# Patient Record
Sex: Male | Born: 1970
Health system: Southern US, Community
[De-identification: ages and names within clinical notes are randomized; demographics above are authoritative.]

## PROBLEM LIST (undated history)

## (undated) DIAGNOSIS — Z9889 Other specified postprocedural states: Secondary | ICD-10-CM

## (undated) DIAGNOSIS — E669 Obesity, unspecified: Secondary | ICD-10-CM

## (undated) DIAGNOSIS — M1990 Unspecified osteoarthritis, unspecified site: Secondary | ICD-10-CM

## (undated) DIAGNOSIS — G4730 Sleep apnea, unspecified: Secondary | ICD-10-CM

## (undated) DIAGNOSIS — R112 Nausea with vomiting, unspecified: Secondary | ICD-10-CM

## (undated) DIAGNOSIS — I10 Essential (primary) hypertension: Secondary | ICD-10-CM

## (undated) DIAGNOSIS — R5383 Other fatigue: Secondary | ICD-10-CM

## (undated) DIAGNOSIS — K219 Gastro-esophageal reflux disease without esophagitis: Secondary | ICD-10-CM

## (undated) DIAGNOSIS — E785 Hyperlipidemia, unspecified: Secondary | ICD-10-CM

## (undated) DIAGNOSIS — R7302 Impaired glucose tolerance (oral): Secondary | ICD-10-CM

## (undated) DIAGNOSIS — C6290 Malignant neoplasm of unspecified testis, unspecified whether descended or undescended: Secondary | ICD-10-CM

## (undated) DIAGNOSIS — E119 Type 2 diabetes mellitus without complications: Secondary | ICD-10-CM

## (undated) HISTORY — DX: Gastro-esophageal reflux disease without esophagitis: K21.9

## (undated) HISTORY — DX: Impaired glucose tolerance (oral): R73.02

## (undated) HISTORY — PX: HERNIA REPAIR: SHX51

## (undated) HISTORY — DX: Obesity, unspecified: E66.9

## (undated) HISTORY — DX: Malignant neoplasm of unspecified testis, unspecified whether descended or undescended: C62.90

## (undated) HISTORY — DX: Other fatigue: R53.83

## (undated) HISTORY — DX: Essential (primary) hypertension: I10

## (undated) HISTORY — DX: Hyperlipidemia, unspecified: E78.5

---

## 1999-07-15 DIAGNOSIS — C6290 Malignant neoplasm of unspecified testis, unspecified whether descended or undescended: Secondary | ICD-10-CM

## 1999-07-15 HISTORY — PX: OTHER SURGICAL HISTORY: SHX169

## 1999-07-15 HISTORY — PX: TESTICLE SURGERY: SHX794

## 1999-07-15 HISTORY — DX: Malignant neoplasm of unspecified testis, unspecified whether descended or undescended: C62.90

## 2002-02-11 ENCOUNTER — Emergency Department (HOSPITAL_COMMUNITY): Admission: EM | Admit: 2002-02-11 | Discharge: 2002-02-11 | Payer: Self-pay | Admitting: Emergency Medicine

## 2002-02-11 ENCOUNTER — Encounter: Payer: Self-pay | Admitting: Emergency Medicine

## 2002-04-06 ENCOUNTER — Ambulatory Visit (HOSPITAL_COMMUNITY): Admission: RE | Admit: 2002-04-06 | Discharge: 2002-04-06 | Payer: Self-pay | Admitting: Oncology

## 2002-04-06 ENCOUNTER — Encounter: Payer: Self-pay | Admitting: Oncology

## 2003-11-20 ENCOUNTER — Emergency Department (HOSPITAL_COMMUNITY): Admission: EM | Admit: 2003-11-20 | Discharge: 2003-11-20 | Payer: Self-pay

## 2004-08-21 ENCOUNTER — Ambulatory Visit: Payer: Self-pay | Admitting: Oncology

## 2005-04-29 ENCOUNTER — Ambulatory Visit: Payer: Self-pay | Admitting: Oncology

## 2006-07-09 ENCOUNTER — Ambulatory Visit: Payer: Self-pay | Admitting: Internal Medicine

## 2006-08-11 ENCOUNTER — Ambulatory Visit: Payer: Self-pay | Admitting: Internal Medicine

## 2006-09-02 ENCOUNTER — Ambulatory Visit: Payer: Self-pay | Admitting: Internal Medicine

## 2006-09-02 ENCOUNTER — Ambulatory Visit: Payer: Self-pay

## 2008-03-31 ENCOUNTER — Ambulatory Visit: Payer: Self-pay

## 2008-03-31 ENCOUNTER — Ambulatory Visit: Payer: Self-pay | Admitting: Internal Medicine

## 2008-03-31 LAB — CONVERTED CEMR LAB
Alkaline Phosphatase: 94 units/L (ref 39–117)
BUN: 11 mg/dL (ref 6–23)
CRP, High Sensitivity: 4 (ref 0.00–5.00)
Calcium: 9.4 mg/dL (ref 8.4–10.5)
Cholesterol: 298 mg/dL (ref 0–200)
Eosinophils Absolute: 0.1 10*3/uL (ref 0.0–0.7)
Eosinophils Relative: 2.4 % (ref 0.0–5.0)
GFR calc Af Amer: 109 mL/min
GFR calc non Af Amer: 90 mL/min
HDL: 34.4 mg/dL — ABNORMAL LOW (ref >39.0)
Hgb A1c MFr Bld: 5.9 % (ref 4.6–6.0)
Lymphocytes Relative: 28.5 % (ref 12.0–46.0)
Neutrophils Relative %: 59.1 % (ref 43.0–77.0)
Potassium: 3.8 meq/L (ref 3.5–5.1)
RBC: 4.57 M/uL (ref 4.22–5.81)
Sodium: 141 meq/L (ref 135–145)
Total Protein: 7.8 g/dL (ref 6.0–8.3)

## 2009-03-05 ENCOUNTER — Telehealth: Payer: Self-pay | Admitting: Internal Medicine

## 2009-03-23 ENCOUNTER — Ambulatory Visit: Payer: Self-pay | Admitting: Internal Medicine

## 2009-03-23 DIAGNOSIS — E7800 Pure hypercholesterolemia, unspecified: Secondary | ICD-10-CM

## 2009-03-23 DIAGNOSIS — I10 Essential (primary) hypertension: Secondary | ICD-10-CM | POA: Insufficient documentation

## 2009-03-23 DIAGNOSIS — E739 Lactose intolerance, unspecified: Secondary | ICD-10-CM

## 2009-03-23 DIAGNOSIS — M25549 Pain in joints of unspecified hand: Secondary | ICD-10-CM

## 2009-03-26 ENCOUNTER — Telehealth: Payer: Self-pay | Admitting: Internal Medicine

## 2009-06-13 ENCOUNTER — Encounter: Payer: Self-pay | Admitting: Internal Medicine

## 2010-01-22 ENCOUNTER — Emergency Department (HOSPITAL_COMMUNITY): Admission: EM | Admit: 2010-01-22 | Discharge: 2010-01-23 | Payer: Self-pay | Admitting: Emergency Medicine

## 2010-01-24 ENCOUNTER — Inpatient Hospital Stay (HOSPITAL_COMMUNITY): Admission: RE | Admit: 2010-01-24 | Discharge: 2010-01-30 | Payer: Self-pay | Admitting: Cardiovascular Disease

## 2010-01-24 ENCOUNTER — Ambulatory Visit: Payer: Self-pay | Admitting: Cardiology

## 2010-01-24 ENCOUNTER — Ambulatory Visit: Payer: Self-pay | Admitting: Gastroenterology

## 2010-01-25 ENCOUNTER — Encounter: Payer: Self-pay | Admitting: Cardiovascular Disease

## 2010-01-30 ENCOUNTER — Encounter (INDEPENDENT_AMBULATORY_CARE_PROVIDER_SITE_OTHER): Payer: Self-pay | Admitting: *Deleted

## 2010-02-12 ENCOUNTER — Inpatient Hospital Stay (HOSPITAL_COMMUNITY): Admission: EM | Admit: 2010-02-12 | Discharge: 2010-02-14 | Payer: Self-pay | Admitting: Emergency Medicine

## 2010-02-12 ENCOUNTER — Telehealth: Payer: Self-pay | Admitting: Gastroenterology

## 2010-03-06 ENCOUNTER — Ambulatory Visit: Payer: Self-pay | Admitting: Internal Medicine

## 2010-03-15 ENCOUNTER — Encounter: Payer: Self-pay | Admitting: Internal Medicine

## 2010-03-21 ENCOUNTER — Ambulatory Visit: Payer: Self-pay | Admitting: Gastroenterology

## 2010-03-21 DIAGNOSIS — K589 Irritable bowel syndrome without diarrhea: Secondary | ICD-10-CM | POA: Insufficient documentation

## 2010-03-21 DIAGNOSIS — C6290 Malignant neoplasm of unspecified testis, unspecified whether descended or undescended: Secondary | ICD-10-CM | POA: Insufficient documentation

## 2010-03-22 ENCOUNTER — Encounter: Payer: Self-pay | Admitting: Internal Medicine

## 2010-04-30 ENCOUNTER — Ambulatory Visit: Payer: Self-pay | Admitting: Gastroenterology

## 2010-08-11 LAB — CONVERTED CEMR LAB
AST: 27 units/L (ref 0–37)
BUN: 12 mg/dL (ref 6–23)
Bilirubin, Direct: 0 mg/dL (ref 0.0–0.3)
CO2: 30 meq/L (ref 19–32)
Chloride: 106 meq/L (ref 96–112)
Cholesterol: 210 mg/dL — ABNORMAL HIGH (ref 0–200)
Glucose, Bld: 98 mg/dL (ref 70–99)
Hgb A1c MFr Bld: 5.9 % (ref 4.6–6.5)
Potassium: 3.9 meq/L (ref 3.5–5.1)
Sodium: 141 meq/L (ref 135–145)
Total Protein: 7.4 g/dL (ref 6.0–8.3)

## 2010-08-13 NOTE — Assessment & Plan Note (Signed)
Summary: rov/DOT physical  Medications Added NEXIUM 40 MG CPDR (ESOMEPRAZOLE MAGNESIUM) Take 1 tablet by mouth two times a day PAXIL 10 MG TABS (PAROXETINE HCL) once daily * BISCODYL 10 mg once daily      Allergies Added: NKDA  Visit Type:  Follow-up Primary Provider:  Dr Clelia Croft  CC:  no complaints.  History of Present Illness: George Wise is a delightful 40 year old male, who is the husband of George Wise our Biomedical scientist, who presents for followup of his hypertension, hyperlipidemia, and glucose intolerance.   Recently has had a very tough time with GI symptoms n/v/d. Has had very thorough work-up and followed in GI. Felt to have IBS. Has lost 30-40 pounds. If misses Nexium get nausea and flatulence. Not back to work yet. No CP and SOB. Starting to get back in groove, GI symptoms permitting. No edema, orthopnea and PND. Gets lightheaded if he stands up too quickly. Not exercising regularly.  Recent HgBA1c 6.1.   Current Medications (verified): 1)  Nexium 40 Mg Cpdr (Esomeprazole Magnesium) .... Take 1 Tablet By Mouth Two Times A Day 2)  Lisinopril 40 Mg Tabs (Lisinopril) .... Take One Tablet By Mouth Daily 3)  Fish Oil   Oil (Fish Oil) .... Daily 4)  Lovastatin 40 Mg Tabs (Lovastatin) .... Take One Tablet By Mouth Daily At Bedtime 5)  Paxil 10 Mg Tabs (Paroxetine Hcl) .... Once Daily 6)  Biscodyl .Marland Kitchen.. 10 Mg Once Daily  Allergies (verified): No Known Drug Allergies  Past History:  Past Medical History: HTN Hyperlipidemia Obesity GERD Glucose intolerance ETT    --2008: Excellent exercise tolerance. No ST-T changes  Review of Systems       As per HPI and past medical history; otherwise all systems negative.   Vital Signs:  Patient profile:   40 year old male Height:      73 inches Weight:      249 pounds BMI:     32.97 Pulse rate:   56 / minute BP sitting:   124 / 70  (left arm) Cuff size:   regular  Vitals Entered By: Hardin Negus, RMA (March 06, 2010 9:26  AM)  Physical Exam  General:  Well appearing. no resp difficulty HEENT: normal Neck: thick supple. no JVD. Carotids 2+ bilat; no bruits. No lymphadenopathy or thryomegaly appreciated. Cor: PMI nondisplaced. Regular rate & rhythm. No rubs, gallops, murmur. Lungs: clear Abdomen: obese soft, nontender, nondistended. Good bowel sounds. Extremities: no cyanosis, clubbing, rash, edema Neuro: alert & orientedx3, cranial nerves grossly intact. moves all 4 extremities w/o difficulty. affect pleasant    Impression & Recommendations:  Problem # 1:  HYPERTENSION, BENIGN (ICD-401.1) Blood pressure well controlled. Continue current regimen.  Problem # 2:  HYPERCHOLESTEROLEMIA (ICD-272.0) Will check lipids/liver panel. Continue statin for now.   Other Orders: EKG w/ Interpretation (93000)  Patient Instructions: 1)  Labs at pcp 2)  Your physician wants you to follow-up in:  1 year.  You will receive a reminder letter in the mail two months in advance. If you don't receive a letter, please call our office to schedule the follow-up appointment.

## 2010-08-13 NOTE — Procedures (Signed)
Summary: Upper Endoscopy  Patient: [name] Note: All result statuses are Final unless otherwise noted.  Tests: (1) Upper Endoscopy (EGD)   EGD Upper Endoscopy       DONE     [facility]     [address]     [place], [place]  [address]           ENDOSCOPY PROCEDURE REPORT           PATIENT:  George Wise, George Wise  MR#:  [id number]     BIRTHDATE:  February 02, 1971, 39 yrs. old  GENDER:  male           ENDOSCOPIST:  Barbette Hair. Arlyce Dice, MD     Referred by:           PROCEDURE DATE:  01/24/2010     PROCEDURE:  EGD, diagnostic     ASA CLASS:  Class II     INDICATIONS:  nausea and vomiting, diarrhea           MEDICATIONS:   Fentanyl 70 mcg IV, Versed 6 mg, glycopyrrolate     (Robinal) 0.2 mcg IV     TOPICAL ANESTHETIC:  Cetacaine Spray           DESCRIPTION OF PROCEDURE:   After the risks benefits and     alternatives of the procedure were thoroughly explained, informed     consent was obtained.  The EG-[id number] ([id number]) endoscope was     introduced through the mouth and advanced to the third portion of     the duodenum, without limitations.  The instrument was slowly     withdrawn as the mucosa was fully examined.     <<PROCEDUREIMAGES>>           The upper, middle, and distal third of the esophagus were     carefully inspected and no abnormalities were noted. The z-line     was well seen at the GEJ. The endoscope was pushed into the fundus     which was normal including a retroflexed view. The antrum,gastric     body, first and second part of the duodenum were unremarkable (see     image001, image002, image004, image005, and image006).     Retroflexed views revealed no abnormalities.    The scope was then     withdrawn from the patient and the procedure completed.           COMPLICATIONS:  None           ENDOSCOPIC IMPRESSION:     1) Normal EGD     RECOMMENDATIONS:Cipro 500mg  bid for presumed gastroenteritis           REPEAT EXAM:  No        ______________________________     Barbette Hair. Arlyce Dice, MD           CC:  Wendall Stade, MD           n.     Rosalie Doctor:   Barbette Hair. Leinaala Catanese at 01/24/2010 05:13 PM           George Wise, [id number]  Note: An exclamation mark (!) indicates a result that was not dispersed into the flowsheet. Document Creation Date: 01/24/2010 5:14 PM _______________________________________________________________________  (1) Order result status: Final Collection or observation date-time: 01/24/2010 17:10 Requested date-time:  Receipt date-time:  Reported date-time:  Referring Physician:   Ordering Physician: Melvia Heaps [phone]) Specimen Source:  Source: Launa Grill Order Number: [id number] Lab site:

## 2010-08-13 NOTE — Progress Notes (Signed)
Summary: Triage   Phone Note Call from Patient Call back at Home Phone [phone]   Caller: wife, Elease Hashimoto Call For: Dr. Arlyce Dice Reason for Call: Talk to Nurse Summary of Call: pt not improving since hospital visit and would like to have f/u appt sooner than sch'ed Initial call taken by: Vallarie Mare,  February 12, 2010 8:48 AM  Follow-up for Phone Call        Pt. discharged from [facility] on 01-30-10, was told if no better in 2 weeks than he needs to be seen. Per pt. wife, pt. continues with excessive bloating/burping, nausea & vomiting, despite phenergan & Zofran. Some loose stools. Wants to be seen sooner than scheduled appt.  Pt. will see Amy Esterwood PAC on 02-13-10 at 3pm.  Follow-up by: Laureen Ochs LPN,  February 12, 2010 9:15 AM  Additional Follow-up for Phone Call Additional follow up Details #1::        Pt's wife phoned Jennye Moccasin [facility]. Per sarah, pt. will need to go to his local ER or Urgent Care for IV hydration and keep his appt. tomorrow. If he is too ill, he should go directly to [facility] ER for evaluation.  Pt's wife states she will take him to [facility] ER. I have advised Jennye Moccasin PAC.  Pt's wife will callback if appt. tomorrow needs to be cancelled. Additional Follow-up by: Laureen Ochs LPN,  February 12, 2010 9:38 AM

## 2010-08-13 NOTE — Assessment & Plan Note (Signed)
Summary: POST HOSPITAL F/U              [facility]    History of Present Illness Visit Type: Follow-up Visit Primary GI MD: Melvia Heaps MD [facility] Primary Provider: Buren Kos, MD Chief Complaint: Post hospital, nausea, vomiting, diarrhea History of Present Illness:   George Wise has returned for followup of his nausea.  He has had 2 hospitalizations for protracted nausea where extensive workup detailed in the discharge summary did not demonstrate any specific abnormalities.  Over the past 4 weeks he has improved.  He has had episodes of mild nausea that are nonspecific any particular foods.  Improvement is coincidental with starting Paxil.  GI symptoms mostly consists of postprandial bloating and nausea.  Bowels are somewhat irregular.   GI Review of Systems    Reports abdominal pain, belching, bloating, nausea, vomiting, and  weight loss.   Weight loss of 40 pounds   Denies acid reflux, chest pain, dysphagia with liquids, dysphagia with solids, heartburn, loss of appetite, vomiting blood, and  weight gain.      Reports constipation, diarrhea, and  irritable bowel syndrome.     Denies anal fissure, black tarry stools, change in bowel habit, diverticulosis, fecal incontinence, heme positive stool, hemorrhoids, jaundice, light color stool, liver problems, rectal bleeding, and  rectal pain.    Current Medications (verified): 1)  Nexium 40 Mg Cpdr (Esomeprazole Magnesium) .... Take 1 Tablet By Mouth Two Times A Day 2)  Lisinopril 40 Mg Tabs (Lisinopril) .... Take One Tablet By Mouth Daily 3)  Lovastatin 40 Mg Tabs (Lovastatin) .... Take One Tablet By Mouth Daily At Bedtime 4)  Paxil 10 Mg Tabs (Paroxetine Hcl) .... Once Daily 5)  Biscodyl .Marland Kitchen.. 10 Mg Once Daily  Allergies (verified): No Known Drug Allergies  Past History:  Past Medical History: Reviewed history from 03/14/2010 and no changes required. HTN Hyperlipidemia Obesity GERD Glucose intolerance ETT    --2008: Excellent  exercise tolerance. No ST-T changes Hx of testicular cancer Chronic constipation Sullivan Lone syndrome   Past Surgical History: Reviewed history from 03/14/2010 and no changes required. Hernia repair Lymph node removed Appendectomy  Family History: Family History of Colon Cancer:GM Family History of Diabetes: GF Family History of Heart Disease: GF  Social History: Tobacco Use - No.  Alcohol Use - no Regular Exercise - yes Drug Use - no Full Time Occupation: Naval architect  Review of Systems  The patient denies allergy/sinus, anemia, anxiety-new, arthritis/joint pain, back pain, blood in urine, breast changes/lumps, change in vision, confusion, cough, coughing up blood, depression-new, fainting, fatigue, fever, headaches-new, hearing problems, heart murmur, heart rhythm changes, itching, menstrual pain, muscle pains/cramps, night sweats, nosebleeds, pregnancy symptoms, shortness of breath, skin rash, sleeping problems, sore throat, swelling of feet/legs, swollen lymph glands, thirst - excessive , urination - excessive , urination changes/pain, urine leakage, vision changes, and voice change.    Vital Signs:  Patient profile:   40 year old male Height:      73 inches Weight:      249.38 pounds BMI:     33.02 Pulse rate:   60 / minute Pulse rhythm:   regular BP sitting:   102 / 64  (left arm) Cuff size:   regular  Vitals Entered By: June McMurray CMA Duncan Dull) (March 21, 2010 2:17 PM)   Impression & Recommendations:  Problem # 1:  IBS (ICD-564.1) Overall his GI symptoms are compatible with severe IBS appear to be improving coincident with therapy with Paxil.  This raises a question of a functional component as well although he denies suffering from anxiety or stress.  Medications #1 continue Paxil #2 I instructed the patient to take it careful dietary history during times when he is symptomatic. #3 consider trial of low-dose amitiza pending his progress  Problem # 2:  Hx of  TESTICULAR CANCER (ICD-186.9) Assessment: Comment Only  Patient Instructions: 1)  Follow up in 6 weeks 2)  The medication list was reviewed and reconciled.  All changed / newly prescribed medications were explained.  A complete medication list was provided to the patient / caregiver.

## 2010-08-13 NOTE — Letter (Signed)
Summary: Custom - Lipid  [facility], Main Office  [address]   [place], [place] [address]   Phone: [phone]  Fax: [phone]     March 22, 2010 MRN: [id number]   George Wise [address] [place], [place]  [address]   Dear [name],  We have reviewed your cholesterol results.  They are as follows:     Total Cholesterol:    185 (Desirable: less than 200)       HDL  Cholesterol:     39  (Desirable: greater than 40 for men and 50 for women)       LDL Cholesterol:       116  (Desirable: less than 100 for low risk and less than 70 for moderate to high risk)       Triglycerides:       150.0  (Desirable: less than 150)  Our recommendations include:  Looks good, continue current medication.  All your other labwork, including liver and kidney functions were normal as well.   Call our office at the number listed above if you have any questions.  Lowering your LDL cholesterol is important, but it is only one of a large number of "risk factors" that may indicate that you are at risk for heart disease, stroke or other complications of hardening of the arteries.  Other risk factors include:   A.  Cigarette Smoking* B.  High Blood Pressure* C.  Obesity* D.   Low HDL Cholesterol (see yours above)* E.   Diabetes Mellitus (higher risk if your is uncontrolled) F.  Family history of premature heart disease G.  Previous history of stroke or cardiovascular disease    *These are risk factors YOU HAVE CONTROL OVER.  For more information, visit .  There is now evidence that lowering the TOTAL CHOLESTEROL AND LDL CHOLESTEROL can reduce the risk of heart disease.  The American Heart Association recommends the following guidelines for the treatment of elevated cholesterol:  1.  If there is now current heart disease and less than two risk factors, TOTAL CHOLESTEROL should be less than 200 and LDL CHOLESTEROL should be less than 100. 2.  If there is current heart disease  or two or more risk factors, TOTAL CHOLESTEROL should be less than 200 and LDL CHOLESTEROL should be less than 70.  A diet low in cholesterol, saturated fat, and calories is the cornerstone of treatment for elevated cholesterol.  Cessation of smoking and exercise are also important in the management of elevated cholesterol and preventing vascular disease.  Studies have shown that 30 to 60 minutes of physical activity most days can help lower blood pressure, lower cholesterol, and keep your weight at a healthy level.  Drug therapy is used when cholesterol levels do not respond to therapeutic lifestyle changes (smoking cessation, diet, and exercise) and remains unacceptably high.  If medication is started, it is important to have you levels checked periodically to evaluate the need for further treatment options.  Thank you,    [employer] Team

## 2010-08-13 NOTE — Letter (Signed)
Summary: New Patient letter  [facility]  [address] [place], [place] [address]   Phone: [phone]  Fax: [phone]       01/30/2010 MRN: [id number]  George Wise [address] [place], [place]  [address]  Dear George Wise,  Welcome to the Gastroenterology Division at [facility].    You are scheduled to see Dr.  Arlyce Dice on 03-19-10 at 10:45a.m. on the 3rd floor at [facility], [address]  We ask that you try to arrive at our office 15 minutes prior to your appointment time to allow for check-in.  We would like you to complete the enclosed self-administered evaluation form prior to your visit and bring it with you on the day of your appointment.  We will review it with you.  Also, please bring a complete list of all your medications or, if you prefer, bring the medication bottles and we will list them.  Please bring your insurance card so that we may make a copy of it.  If your insurance requires a referral to see a specialist, please bring your referral form from your primary care physician.  Co-payments are due at the time of your visit and may be paid by cash, check or credit card.     Your office visit will consist of a consult with your physician (includes a physical exam), any laboratory testing he/she may order, scheduling of any necessary diagnostic testing (e.g. x-ray, ultrasound, CT-scan), and scheduling of a procedure (e.g. Endoscopy, Colonoscopy) if required.  Please allow enough time on your schedule to allow for any/all of these possibilities.    If you cannot keep your appointment, please call [phone] to cancel or reschedule prior to your appointment date.  This allows [place] the opportunity to schedule an appointment for another patient in need of care.  If you do not cancel or reschedule by 5 p.m. the business day prior to your appointment date, you will be charged a $50.00 late cancellation/no-show fee.    Thank you for choosing [facility] for your medical needs.  We appreciate the opportunity to care for you.  Please visit [place] at our website  to learn more about our practice.                     Sincerely,                                                             The Gastroenterology Division

## 2010-08-13 NOTE — Letter (Signed)
Summary: [facility] Care Management   [facility] Care Management   Imported By: Roderic Ovens 02/14/2010 15:23:28  _____________________________________________________________________  External Attachment:    Type:   Image     Comment:   External Document

## 2010-08-13 NOTE — Assessment & Plan Note (Signed)
Summary: 6-WEEK F/U APPT...LSW.    History of Present Illness Visit Type: Follow-up Visit Primary GI MD: Melvia Heaps MD [facility] Primary Hersey Maclellan: Buren Kos, MD Requesting Khristen Cheyney: n/a Chief Complaint: Patient here for 6 week f/u nausea. He states that he was feeling better until 1-2 days ago when he began feeling slightly nauseated again with some body aches. No vomiting. There is some indigestion. History of Present Illness:   [name] has returned for followup of his nausea.  Since his last visit he has  generally done well.  He clearly states that tension and anxiety cause body aches and nausea.  He has had some myalgias and nausea for the past 24 hours.  With few exceptions, however, he has  generally felt well.  Weight has increased.   GI Review of Systems    Reports belching, bloating, loss of appetite, and  nausea.      Denies abdominal pain, acid reflux, chest pain, dysphagia with liquids, dysphagia with solids, heartburn, vomiting, vomiting blood, weight loss, and  weight gain.      Reports irritable bowel syndrome.     Denies anal fissure, black tarry stools, change in bowel habit, constipation, diarrhea, diverticulosis, fecal incontinence, heme positive stool, hemorrhoids, jaundice, light color stool, liver problems, rectal bleeding, and  rectal pain.    Current Medications (verified): 1)  Nexium 40 Mg Cpdr (Esomeprazole Magnesium) .... Take 1 Tablet By Mouth Two Times A Day 2)  Lisinopril 40 Mg Tabs (Lisinopril) .... Take One Tablet By Mouth Daily 3)  Lovastatin 40 Mg Tabs (Lovastatin) .... Take One Tablet By Mouth Daily At Bedtime 4)  Paxil 10 Mg Tabs (Paroxetine Hcl) .... Once Daily 5)  Zantac 150 Mg Tabs (Ranitidine Hcl) .... Take 1 Tablet By Mouth As Needed  Allergies (verified): No Known Drug Allergies  Past History:  Past Medical History: Reviewed history from 03/14/2010 and no changes required. HTN Hyperlipidemia Obesity GERD Glucose intolerance ETT  --2008: Excellent exercise tolerance. No ST-T changes Hx of testicular cancer Chronic constipation Sullivan Lone syndrome   Past Surgical History: Hernia repair Lymph node removed-abdominal area Appendectomy  Family History: Family History of Diabetes: GF Family History of Heart Disease: GF No FH of Colon Cancer: Grandmother with "male cancer"-type unknown Family History of Stomach Cancer: Grandmother (mets)  Social History: Tobacco Use - No.  Alcohol Use - no Regular Exercise - no Drug Use - no Full Time Occupation: Naval architect  Review of Systems       The patient complains of allergy/sinus and arthritis/joint pain.  The patient denies anemia, anxiety-new, back pain, blood in urine, breast changes/lumps, change in vision, confusion, cough, coughing up blood, depression-new, fainting, fatigue, fever, headaches-new, hearing problems, heart murmur, heart rhythm changes, itching, menstrual pain, muscle pains/cramps, night sweats, nosebleeds, pregnancy symptoms, shortness of breath, skin rash, sleeping problems, sore throat, swelling of feet/legs, swollen lymph glands, thirst - excessive , urination - excessive , urination changes/pain, urine leakage, vision changes, and voice change.    Vital Signs:  Patient profile:   40 year old male Height:      73 inches Weight:      246.38 pounds BMI:     32.62 BSA:     2.35 Pulse rate:   80 / minute Pulse rhythm:   regular BP sitting:   118 / 80  (right arm)  Vitals Entered By: Lamona Curl CMA Duncan Dull) (April 30, 2010 2:40 PM)   Impression & Recommendations:  Problem # 1:  IBS (ICD-564.1)  Assessment Improved Patient is generally doing well with a minimum of GI complaints.  Recommendations #1 continue Paxil  Patient Instructions: 1)  Copy sent to : Buren Kos, MD 2)  Call back as needed  3)  The medication list was reviewed and reconciled.  All changed / newly prescribed medications were explained.  A complete  medication list was provided to the patient / caregiver.

## 2010-08-13 NOTE — Letter (Signed)
Summary: Return to Work  [facility]  [address] [place], [place] [address]   Phone: [phone]  Fax: [phone]    03/21/2010  TO: WHOM IT MAY CONCERN  RE: George Wise [address] [id number]   The above named individual is under my medical care and may return to work on: 03/25/2010 Monday    If you have any further questions or need additional information, please call.     Sincerely,  Robert Kaplan,MD     typed by: Merri Ray CMA (AAMA)

## 2010-09-27 LAB — DIFFERENTIAL
Basophils Absolute: 0.1 10*3/uL (ref 0.0–0.1)
Eosinophils Absolute: 0.1 10*3/uL (ref 0.0–0.7)
Eosinophils Relative: 2 % (ref 0–5)
Lymphocytes Relative: 23 % (ref 12–46)
Monocytes Absolute: 0.5 10*3/uL (ref 0.1–1.0)
Monocytes Relative: 8 % (ref 3–12)
Neutrophils Relative %: 67 % (ref 43–77)

## 2010-09-27 LAB — CBC
HCT: 43.7 % (ref 39.0–52.0)
MCV: 91 fL (ref 78.0–100.0)
Platelets: 247 10*3/uL (ref 150–400)
RBC: 4.8 MIL/uL (ref 4.22–5.81)
RDW: 12.2 % (ref 11.5–15.5)

## 2010-09-27 LAB — LACTATE DEHYDROGENASE: LDH: 130 U/L (ref 94–250)

## 2010-09-27 LAB — HEPATIC FUNCTION PANEL
ALT: 34 U/L (ref 0–53)
Indirect Bilirubin: 1.5 mg/dL — ABNORMAL HIGH (ref 0.3–0.9)
Total Bilirubin: 1.7 mg/dL — ABNORMAL HIGH (ref 0.3–1.2)

## 2010-09-27 LAB — COMPREHENSIVE METABOLIC PANEL
ALT: 43 U/L (ref 0–53)
AST: 23 U/L (ref 0–37)
Albumin: 4.2 g/dL (ref 3.5–5.2)
BUN: 11 mg/dL (ref 6–23)
Calcium: 9.9 mg/dL (ref 8.4–10.5)
GFR calc Af Amer: >60 mL/min (ref >60)
Glucose, Bld: 109 mg/dL — ABNORMAL HIGH (ref 70–99)
Potassium: 3.9 mEq/L (ref 3.5–5.1)
Sodium: 139 mEq/L (ref 135–145)
Total Protein: 7.7 g/dL (ref 6.0–8.3)

## 2010-09-27 LAB — HCG, QUANTITATIVE, PREGNANCY: hCG, Beta Chain, Quant, S: <2 m[IU]/mL (ref <5)

## 2010-09-27 LAB — BASIC METABOLIC PANEL
BUN: 9 mg/dL (ref 6–23)
Chloride: 102 mEq/L (ref 96–112)
Glucose, Bld: 114 mg/dL — ABNORMAL HIGH (ref 70–99)
Potassium: 3.8 mEq/L (ref 3.5–5.1)

## 2010-09-27 LAB — SEDIMENTATION RATE: Sed Rate: 13 mm/hr (ref 0–16)

## 2010-09-28 LAB — H. PYLORI ANTIBODY, IGG: H Pylori IgG: <0.4 {ISR}

## 2010-09-28 LAB — CLOSTRIDIUM DIFFICILE EIA
C difficile Toxins A+B, EIA: NEGATIVE
C difficile Toxins A+B, EIA: NEGATIVE

## 2010-09-28 LAB — BASIC METABOLIC PANEL
Chloride: 104 mEq/L (ref 96–112)
Creatinine, Ser: 1.2 mg/dL (ref 0.4–1.5)
GFR calc non Af Amer: >60 mL/min (ref >60)
Potassium: 3.5 mEq/L (ref 3.5–5.1)
Sodium: 139 mEq/L (ref 135–145)

## 2010-09-28 LAB — DIFFERENTIAL
Basophils Relative: 1 % (ref 0–1)
Eosinophils Absolute: 0.2 10*3/uL (ref 0.0–0.7)
Monocytes Absolute: 0.5 10*3/uL (ref 0.1–1.0)
Monocytes Relative: 10 % (ref 3–12)
Neutro Abs: 3 10*3/uL (ref 1.7–7.7)
Neutrophils Relative %: 53 % (ref 43–77)

## 2010-09-28 LAB — CBC
MCHC: 34.6 g/dL (ref 30.0–36.0)
RDW: 12.3 % (ref 11.5–15.5)
WBC: 5.6 10*3/uL (ref 4.0–10.5)

## 2010-09-28 LAB — HEPATIC FUNCTION PANEL
Alkaline Phosphatase: 73 U/L (ref 39–117)
Indirect Bilirubin: 0.8 mg/dL (ref 0.3–0.9)
Total Protein: 6.8 g/dL (ref 6.0–8.3)

## 2010-09-28 LAB — SEDIMENTATION RATE: Sed Rate: 20 mm/h — ABNORMAL HIGH (ref 0–16)

## 2010-09-28 LAB — PROCALCITONIN

## 2010-09-28 LAB — GIARDIA/CRYPTOSPORIDIUM SCREEN(EIA)
Cryptosporidium Screen (EIA): NEGATIVE
Giardia Screen - EIA: NEGATIVE

## 2010-09-28 LAB — AMYLASE: Amylase: 29 U/L (ref 0–105)

## 2010-09-28 LAB — LIPASE, BLOOD: Lipase: 28 U/L (ref 11–59)

## 2010-09-28 LAB — HEMOCCULT GUIAC POC 1CARD (OFFICE)
Fecal Occult Bld: NEGATIVE
Fecal Occult Bld: NEGATIVE

## 2010-09-29 LAB — TSH: TSH: 0.967 u[IU]/mL (ref 0.350–4.500)

## 2010-09-29 LAB — POCT CARDIAC MARKERS
CKMB, poc: 2 ng/mL (ref 1.0–8.0)
Myoglobin, poc: 89.7 ng/mL (ref 12–200)

## 2010-09-29 LAB — URINALYSIS, ROUTINE W REFLEX MICROSCOPIC
Bilirubin Urine: NEGATIVE
Bilirubin Urine: NEGATIVE
Glucose, UA: NEGATIVE mg/dL
Hgb urine dipstick: NEGATIVE
Ketones, ur: NEGATIVE mg/dL
Nitrite: NEGATIVE
Nitrite: NEGATIVE
Specific Gravity, Urine: 1.015 (ref 1.005–1.030)
Specific Gravity, Urine: 1.023 (ref 1.005–1.030)
pH: 6 (ref 5.0–8.0)
pH: 7 (ref 5.0–8.0)

## 2010-09-29 LAB — DIFFERENTIAL
Eosinophils Relative: 1 % (ref 0–5)
Eosinophils Relative: 2 % (ref 0–5)
Lymphocytes Relative: 24 % (ref 12–46)
Lymphocytes Relative: 30 % (ref 12–46)
Lymphs Abs: 1.8 10*3/uL (ref 0.7–4.0)
Lymphs Abs: 2.8 10*3/uL (ref 0.7–4.0)
Monocytes Absolute: 0.5 10*3/uL (ref 0.1–1.0)
Monocytes Absolute: 0.8 10*3/uL (ref 0.1–1.0)
Monocytes Relative: 7 % (ref 3–12)
Neutro Abs: 5 10*3/uL (ref 1.7–7.7)

## 2010-09-29 LAB — COMPREHENSIVE METABOLIC PANEL
AST: 22 U/L (ref 0–37)
AST: 23 U/L (ref 0–37)
Albumin: 4 g/dL (ref 3.5–5.2)
Albumin: 4.1 g/dL (ref 3.5–5.2)
BUN: 7 mg/dL (ref 6–23)
Calcium: 9.2 mg/dL (ref 8.4–10.5)
Calcium: 9.3 mg/dL (ref 8.4–10.5)
Chloride: 102 mEq/L (ref 96–112)
Creatinine, Ser: 1.07 mg/dL (ref 0.4–1.5)
Creatinine, Ser: 1.38 mg/dL (ref 0.4–1.5)
GFR calc Af Amer: >60 mL/min (ref >60)
GFR calc Af Amer: >60 mL/min (ref >60)
Total Bilirubin: 1 mg/dL (ref 0.3–1.2)
Total Protein: 7.1 g/dL (ref 6.0–8.3)

## 2010-09-29 LAB — MAGNESIUM: Magnesium: 2.1 mg/dL (ref 1.5–2.5)

## 2010-09-29 LAB — CBC
MCH: 32.3 pg (ref 26.0–34.0)
MCH: 32.7 pg (ref 26.0–34.0)
MCHC: 34.1 g/dL (ref 30.0–36.0)
Platelets: 214 10*3/uL (ref 150–400)
Platelets: 255 10*3/uL (ref 150–400)
RBC: 4.55 MIL/uL (ref 4.22–5.81)
RBC: 4.74 MIL/uL (ref 4.22–5.81)
RDW: 12.3 % (ref 11.5–15.5)
RDW: 12.7 % (ref 11.5–15.5)

## 2010-09-29 LAB — GIARDIA/CRYPTOSPORIDIUM SCREEN(EIA)

## 2010-09-29 LAB — STOOL CULTURE

## 2010-09-29 LAB — URINE CULTURE
Colony Count: NO GROWTH
Culture: NO GROWTH

## 2010-09-29 LAB — LIPASE, BLOOD: Lipase: 30 U/L (ref 11–59)

## 2010-11-20 ENCOUNTER — Other Ambulatory Visit: Payer: Self-pay | Admitting: *Deleted

## 2010-11-20 MED ORDER — LISINOPRIL 40 MG PO TABS: 40.0000 mg | ORAL_TABLET | Freq: Every day | ORAL | Status: DC

## 2010-11-26 ENCOUNTER — Other Ambulatory Visit: Payer: Self-pay | Admitting: Internal Medicine

## 2010-11-26 NOTE — Assessment & Plan Note (Signed)
[facility]                            CARDIOLOGY OFFICE NOTE   Jayceion, Lisenby SINA LUCCHESI                    MRN:          [id number]  DATE:03/31/2008                            DOB:          01/17/1971    PRIMARY CARE PHYSICIAN:  Kirstie Peri, MD in [place].   INTERVAL HISTORY:  Mr. Gramling is a delightful 40 year old male, who is  the husband of Sulaiman Imbert our Biomedical scientist, who presents for followup of  his hypertension, hyperlipidemia, and glucose intolerance.   We saw him for the first time back in January 2008 when he was diagnosed  with severe hyperlipidemia and a DOT physical with a total cholesterol  of almost 300 and LDL of 201.  Subsequent to that, he became very  aggressive with diet and exercise plan.  He started Crestor and was  working out on the treadmill.  He lost probably 30 or 40 pounds.   Unfortunately over the past few months, he has dropped his exercise  program and also stopped taking his Crestor, as he feels achy and tired  all the time.  He has also been taking his blood pressure at home and  notes his systolics to be around 140.  He has not had any chest pain or  shortness of breath.  No orthopnea or PND.  No lower extremity edema.   CURRENT MEDICATIONS:  1. Lisinopril 10 mg a day.  2. Nexium 40 mg a day.  3. Fish oil 2 g a day.  4. Sinemet.   PHYSICAL EXAMINATION:  GENERAL:  He is no acute distress, ambulatory  around the clinic without any respiratory difficulty.  VITAL SIGNS:  Blood pressure is 126/90, heart rate 70, weight is 266.  HEENT:  Normal.  NECK:  Supple and thick.  No JVD.  Carotids are 2+ bilaterally with no  bruits.  There is no lymphadenopathy or thyromegaly.  CARDIAC:  PMI is nondisplaced.  Regular rate and rhythm with an S4.  No  murmurs.  LUNGS:  Clear.  ABDOMEN:  Obese, nontender, nondistended.  No obvious  hepatosplenomegaly, no bruits, no masses.  Good bowel sounds.  EXTREMITIES:  Warm with no  cyanosis, clubbing, or edema.  No rash.  No  xanthomas.  NEURO:  Alert and oriented x3.  Cranial nerves II-XII are intact.  Moves  all 4 extremities without difficulty.  Affect is pleasant.   EKG shows  sinus arrhythmia at a rate of 70, PR interval is 200  milliseconds with a borderline first-degree AV block.  No ST-T wave  abnormalities.   ASSESSMENT AND PLAN:  1. Hypertension, diastolic pressure is mildly elevated.  We will go      ahead and increase lisinopril to 20.  Check a BMET in a week.  2. Hyperlipidemia.  I am very concerned that his cholesterol will go      back up and now that he is off his diet and exercise program and he      stopped taking his statin.  We will recheck lipids today and I will      get back to  my suspect we will need to restart his statin.  I did      remind him the need for lifestyle modification and need to resume      his exercise program.  3. Hyperglycemia.  We will check a hemoglobin A1c.  Once again, I      reminded him the need for diet and weight loss.   DISPOSITION:  Pending his lab results.     Bevelyn Buckles. Bensimhon, MD  Electronically Signed    DRB/MedQ  DD: 03/31/2008  DT: 04/01/2008  Job #: [id number]

## 2010-11-29 NOTE — Procedures (Signed)
[facility]                              EXERCISE TREADMILL   NAME:Fredericksen, MATTHE SLOANE                    MRN:          [id number]  DATE:09/02/2006                            DOB:          [birth year]    Mr. Peeples is a 40 year old male with a history of hypertension and  hyperlipidemia who has recently started on a weight loss program and is  exercising routinely. Given his risk factors, we performed an exercise  treadmill test for routine screening for cardiovascular disease.   RESTING DATA:  Baseline blood pressure was 142/83. EKG showed sinus  rhythm at a rate of 81 with no significant ST-T wave abnormalities.   EXERCISE DATA:  Mr. Kovalenko walked for 13 minutes and 4 seconds on a  standard Bruce protocol going a third of the way into the fifth stage.  He stopped due to fatigue. There was no chest pain or shortness of  breath. Peak mets was 15.3, peak heart rate was 176 which is 97% of age  predicted maximum heart rate. There are no significant ST-T wave changes  with exercise. Peak blood pressure was 213/70 which was consistent with  a hypertensive response to exercise. There was good heart rate recovery.   ASSESSMENT:  1. Normal exercise treadmill test with excellent exercise capacity and      no evidence of exercise induced ischemia.  2. Hypertensive response to exercise.     Bevelyn Buckles. Bensimhon, MD  Electronically Signed    DRB/MedQ  DD: 09/02/2006  DT: 09/02/2006  Job #: [id number]

## 2010-11-29 NOTE — Assessment & Plan Note (Signed)
[facility]                            CARDIOLOGY OFFICE NOTE   NAME:George Wise, George Wise                    MRN:          [id number]  DATE:08/11/2006                            DOB:          11-22-1970    NEW PATIENT EVALUATION   PRIMARY CARE PHYSICIAN:  Dr. Kirstie Peri in [place].   PATIENT IDENTIFICATION:  Mr. Mazor is a delightful, 40 year old male who  is the husband of Treyvion Durkee our Biomedical scientist who presents for further  evaluation of his hypertension and hyperlipidemia.   HISTORY OF PRESENT ILLNESS:  [name] tells me that he has a long history of  a borderline hypertension.  Earlier this year, he was found to have a  severe hyperlipidemia at a DOT physical with a total cholesterol near  300 and a LDL of 201.  Since that time, he has been very motivated with  his diet and exercise program.  He is walking on the treadmill 2-3 days  a week doing 2 miles in 30 minutes.  He has also been very careful with  his diet, cutting out many fatty foods.  He is helped greatly by Rosann Auerbach,  his wife, who also keeps an eye closely on his diet.  He has also been  taking Crestor 10 mg as well as lisinopril for his blood pressure.  He  denies any chest pain or shortness of breath and no significant lower  extremity edema, orthopnea, or PND.   REVIEW OF SYSTEMS:  Notable for asthma but otherwise all systems are  negative except for HPI and problem list.   PROBLEM LIST:  1. Hypertension.  2. Hyperlipidemia.  3. Obesity.  4. History of testicular cancer status post resection as well as      resection of lymph nodes from his abdomen.  This was in 2001.  5. Gastroesophageal reflux disease.   CURRENT MEDICATIONS:  1. Crestor 10 daily.  2. Lisinopril 10 daily.  3. Nexium 40 daily.  4. Fish oil.   SOCIAL HISTORY:  He is married.  He denies any history of tobacco or  alcohol use.  He works as a Naval architect.   FAMILY HISTORY:  Both his mother and father are alive  with no  significant coronary artery disease; however, he does have a significant  history of coronary artery disease on his father's side but this has not  been premature.   PHYSICAL EXAM:  He is well-appearing, no acute distress, ambulates  around the clinic without any respiratory difficulty.  Blood pressure is 108/66.  Heart rate is 63.  His weight is 249 pounds  which is down 21 pounds from previous.  HEENT:  Sclerae anicteric.  EOMI.  No xanthelasma's.  Mucous membranes  are moist.  Oropharynx is clear.  Dentition is good.  NECK:  Supple.  No jugular venous distention.  Carotids are 2+  bilaterally without any bruits.  There is no lymphadenopathy or  thyromegaly.  CARDIAC:  His point of maximum impulse is nondisplaced.  He has no  murmurs, rubs, or gallops.  LUNGS:  Clear.  ABDOMEN:  Obese, nontender, nondistended.  No hepatosplenomegaly.  No  bruits.  No masses. He has a well-healed midline surgical scar.  EXTREMITIES:  Warm with no cyanosis, clubbing, or edema.  DP pulses are  2+ bilaterally.  NEURO:  He is alert and oriented x3.  Cranial nerves II through XII are  intact.  He moves all 4 extremities without difficulty.  Affect is very  pleasant.   EKG shows a normal sinus rhythm with sinus arrhythmia at a rate of 63.  No ST or T wave changes.  Most recent lipids show a total cholesterol of  130, triglycerides 98, HDL 37, LDL of 74.  His fasting glucose was 115.  He has normal kidney function.   ASSESSMENT AND PLAN:  1. Hypertension.  This is under good control.  Continue current      therapy.  2. Hyperlipidemia.  He has had a great improvement with drug therapy      and behavioral changes.  I have congratulated him on this and I      have told him to continue.  3. Glucose intolerance.  He is a borderline diabetic.  I told him that      he needs to continue with his exercise and weight loss program with      the aim of losing a half a pound a week.   DISPOSITION:  We  will set him up for a routine screening treadmill test  given his risk factors and the fact that he is going to be doing more  exercise.  We will see him back in several months for followup.     Bevelyn Buckles. Bensimhon, MD  Electronically Signed    DRB/MedQ  DD: 08/11/2006  DT: 08/11/2006  Job #: [id number]   cc:   Kirstie Peri, MD

## 2011-02-14 ENCOUNTER — Encounter (HOSPITAL_COMMUNITY): Payer: Self-pay | Admitting: *Deleted

## 2011-02-17 ENCOUNTER — Ambulatory Visit (HOSPITAL_COMMUNITY)
Admission: RE | Admit: 2011-02-17 | Discharge: 2011-02-17 | Disposition: A | Discharge status: Home / Self Care | Payer: 59 | Source: Ambulatory Visit | Attending: Internal Medicine | Admitting: Internal Medicine

## 2011-02-17 ENCOUNTER — Encounter: Payer: Self-pay | Admitting: Internal Medicine

## 2011-02-17 DIAGNOSIS — Z024 Encounter for examination for driving license: Secondary | ICD-10-CM | POA: Insufficient documentation

## 2011-02-17 DIAGNOSIS — E7800 Pure hypercholesterolemia, unspecified: Secondary | ICD-10-CM | POA: Insufficient documentation

## 2011-02-17 DIAGNOSIS — R5383 Other fatigue: Secondary | ICD-10-CM

## 2011-02-17 DIAGNOSIS — C6290 Malignant neoplasm of unspecified testis, unspecified whether descended or undescended: Secondary | ICD-10-CM | POA: Insufficient documentation

## 2011-02-17 DIAGNOSIS — I10 Essential (primary) hypertension: Secondary | ICD-10-CM | POA: Insufficient documentation

## 2011-02-17 DIAGNOSIS — R7309 Other abnormal glucose: Secondary | ICD-10-CM | POA: Insufficient documentation

## 2011-02-17 DIAGNOSIS — E785 Hyperlipidemia, unspecified: Secondary | ICD-10-CM

## 2011-02-17 DIAGNOSIS — G4733 Obstructive sleep apnea (adult) (pediatric): Secondary | ICD-10-CM | POA: Insufficient documentation

## 2011-02-17 DIAGNOSIS — R5381 Other malaise: Secondary | ICD-10-CM | POA: Insufficient documentation

## 2011-02-17 DIAGNOSIS — E739 Lactose intolerance, unspecified: Secondary | ICD-10-CM

## 2011-02-17 HISTORY — DX: Other fatigue: R53.83

## 2011-02-17 LAB — COMPREHENSIVE METABOLIC PANEL
ALT: 28 U/L (ref 0–53)
AST: 18 U/L (ref 0–37)
Alkaline Phosphatase: 108 U/L (ref 39–117)
CO2: 28 mEq/L (ref 19–32)
Calcium: 10.1 mg/dL (ref 8.4–10.5)
Chloride: 98 mEq/L (ref 96–112)
GFR calc non Af Amer: >60 mL/min (ref >60)
Potassium: 4.1 mEq/L (ref 3.5–5.1)
Sodium: 136 mEq/L (ref 135–145)

## 2011-02-17 LAB — HEMOGLOBIN A1C: Mean Plasma Glucose: 134 mg/dL — ABNORMAL HIGH (ref <117)

## 2011-02-17 LAB — LIPID PANEL
HDL: 55 mg/dL (ref >39)
Total CHOL/HDL Ratio: 3.9 RATIO
VLDL: 26 mg/dL (ref 0–40)

## 2011-02-17 LAB — CBC: Platelets: 244 10*3/uL (ref 150–400)

## 2011-02-17 LAB — T3: T3, Total: 90.1 ng/dl (ref 80.0–204.0)

## 2011-02-17 NOTE — Assessment & Plan Note (Signed)
Due for recheck today. Continue statin. Goal LDL < 100.

## 2011-02-17 NOTE — Assessment & Plan Note (Signed)
Improved. Asked him to check it at home at least 1x/week. Also encouraged him to resume his exercise program.

## 2011-02-17 NOTE — Assessment & Plan Note (Signed)
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

## 2011-02-17 NOTE — Patient Instructions (Signed)
Labs today  You have been referred to Dr Craige Cotta in Pulmonary for a Sleep Study  Your physician wants you to follow-up in: 1 year.  You will receive a reminder letter in the mail two months in advance. If you don't receive a letter, please call our office to schedule the follow-up appointment.

## 2011-02-17 NOTE — Assessment & Plan Note (Signed)
I suspect this is primarily due to underlying OSA but low testosterone levels may also be playing a role given his history of testicular CA. Will refer for sleep evaluation and labs including CBC, thyroid panel and serum testosterone level.

## 2011-02-17 NOTE — Progress Notes (Unsigned)
HPI: George Wise is a delightful 40 year old male, who is the husband of George Wise our Biomedical scientist, who presents for followup of his hypertension, hyperlipidemia, obesity, IBS and glucose intolerance. He also has h/o testicular CA s/p unilateral orchiectomy and chemotherapy ~2001.  Presents for DOT physical. Overall doing fairly well however he has had a problem with his energy level. Very rundown. Has to pace himself and take rest breaks. Wife says he snores a lot at night. Often sleeps in recliner. Recently got over severe URI.   No problem with CP, dyspnea, orthopnea or PND. Mild ankle edema. No headaches. No problems with vision. No syncope. Concerned about low testosterone.   Previously taking BP at home but not checking regularly any more because it was so good.    ROS: All systems negative except as listed in HPI, PMH and Problem List.  Past Medical History  Diagnosis Date  . Hypertension   . Hyperlipidemia   . Obesity   . GERD (gastroesophageal reflux disease)   . Glucose intolerance (impaired glucose tolerance)   . Testicular cancer 2001    Current Outpatient Prescriptions  Medication Sig Dispense Refill  . lisinopril (PRINIVIL,ZESTRIL) 40 MG tablet Take 1 tablet (40 mg total) by mouth daily.  90 tablet  3  . lovastatin (MEVACOR) 40 MG tablet Take 40 mg by mouth at bedtime.        Marland Kitchen NEXIUM 40 MG capsule TAKE 1 CAPSULE BY MOUTH TWICE DAILY  60 capsule  3  . PARoxetine (PAXIL) 10 MG tablet Take 10 mg by mouth every morning.           PHYSICAL EXAM:  General:  Well appearing. No resp difficulty HEENT: normal Neck: supple. JVP flat. Carotids 2+ bilaterally; no bruits. No lymphadenopathy or thryomegaly appreciated. Cor: PMI normal. Regular rate & rhythm. No rubs, gallops or murmurs. Lungs: clear Abdomen: soft, nontender, nondistended. No hepatosplenomegaly. No bruits or masses. Good bowel sounds. Extremities: no cyanosis, clubbing, rash, edema Neuro: alert & orientedx3,  cranial nerves grossly intact. Moves all 4 extremities w/o difficulty. Affect pleasant.   ASSESSMENT & PLAN:

## 2011-03-05 ENCOUNTER — Ambulatory Visit (INDEPENDENT_AMBULATORY_CARE_PROVIDER_SITE_OTHER): Payer: 59 | Admitting: Pulmonary Disease

## 2011-03-05 ENCOUNTER — Encounter: Payer: Self-pay | Admitting: Pulmonary Disease

## 2011-03-05 VITALS — BP 110/80 | HR 71 | Temp 98.1°F | Ht 73.0 in | Wt 262.6 lb

## 2011-03-05 DIAGNOSIS — R5383 Other fatigue: Secondary | ICD-10-CM

## 2011-03-05 DIAGNOSIS — R5381 Other malaise: Secondary | ICD-10-CM

## 2011-03-05 NOTE — Assessment & Plan Note (Signed)
He has snoring, witnessed apnea, daytime fatigue, and history of hypertension.  I am concerned he could have sleep apnea.    I explained how sleep apnea can affect the patient's health.  Driving precautions and importance of weight loss were discussed.  Treatment options for sleep apnea were reviewed.  To further assess will arrange for in lab sleep study.

## 2011-03-05 NOTE — Patient Instructions (Signed)
Will schedule sleep test Will schedule follow up after sleep test reviewed 

## 2011-03-05 NOTE — Progress Notes (Signed)
Subjective:    Patient ID: George Wise, male    DOB: 1970/11/10, 40 y.o.   MRN: [id number]  HPI CC: George Wise  40 yo male for sleep evaluation.  He is followed by cardiology for hypertension.  He was noted to have daytime fatigue.  His father has a history of OSA.  His wife reports that he snores, and stops breathing while asleep.  He goes to bed between 9 and 11 pm.  He sometimes sleeps in a recliner.  He has no trouble falling asleep.  He wakes up at times hearing himself snore.  He will also wake up from a dream and feel like he can't move briefly.  He gets up at 7 am.  He denies morning headaches.  He has more trouble with his reflux at night, especially when sleeping on his back.  The patient denies sleep walking, sleep talking, bruxism, or nightmares.  There is no history of restless legs.  The patient denies sleep hallucinations, sleep paralysis, or cataplexy.  He has gained about 20 lbs.  He does not smoke cigarettes or drink alcohol.  He works as a Hospital doctor.  Epworth score is 7 out of 24.  Past Medical History  Diagnosis Date  . Hypertension   . Hyperlipidemia   . Obesity   . GERD (gastroesophageal reflux disease)   . Glucose intolerance (impaired glucose tolerance)   . Testicular cancer 2001     Family History  Problem Relation Age of Onset  . Diabetes Paternal Grandfather   . Stroke Paternal Grandfather   . Ovarian cancer Paternal Grandmother   . Stomach cancer Paternal Grandmother      History   Social History  . Marital Status: Married    Spouse Name: N/A    Number of Children: 1  . Years of Education: N/A   Occupational History  . truck driver    Social History Main Topics  . Smoking status: Never Smoker   . Smokeless tobacco: Not on file  . Alcohol Use: No  . Drug Use: No  . Sexually Active: Not on file   Other Topics Concern  . Not on file   Social History Narrative  . No narrative on file     No Known Allergies   Review of Systems   HENT: Positive for trouble swallowing.   Cardiovascular: Positive for leg swelling.  Acid Heartburn/indigestion      Objective:   Physical Exam  BP 110/80  Pulse 71  Temp(Src) 98.1 F (36.7 C) (Oral)  Ht 6\' 1"  (1.854 m)  Wt 262 lb 9.6 oz (119.115 kg)  BMI 34.65 kg/m2  SpO2 95%  General - Obese HEENT - PERRLA, EOMI, no sinus tenderness, no oral exudate, MP3, no LAN, no thyromegaly Chest - CTA Cardiac - s1s2 no murmur, pulses symmetric Abd - soft, nontender Ext - no e/c/c Neuro - normal strength, CN intact Psych - normal mood/behavior    Assessment & Plan:   Fatigue He has snoring, witnessed apnea, daytime fatigue, and history of hypertension.  I am concerned he could have sleep apnea.    I explained how sleep apnea can affect the patient's health.  Driving precautions and importance of weight loss were discussed.  Treatment options for sleep apnea were reviewed.  To further assess will arrange for in lab sleep study.     Updated Medication List Outpatient Encounter Prescriptions as of 03/05/2011  Medication Sig Dispense Refill  . lisinopril (PRINIVIL,ZESTRIL) 40 MG tablet Take 1  tablet (40 mg total) by mouth daily.  90 tablet  3  . NEXIUM 40 MG capsule TAKE 1 CAPSULE BY MOUTH TWICE DAILY  60 capsule  3  . PARoxetine (PAXIL) 10 MG tablet Take 10 mg by mouth every morning.        Marland Kitchen DISCONTD: lovastatin (MEVACOR) 40 MG tablet Take 40 mg by mouth at bedtime.

## 2011-03-12 ENCOUNTER — Telehealth (HOSPITAL_COMMUNITY): Payer: Self-pay | Admitting: *Deleted

## 2011-03-12 NOTE — Telephone Encounter (Signed)
Per Dr Gala Romney pt needs to be referred to Dr Sharl Ma for diabetes and low testosterone, order placed

## 2011-03-25 ENCOUNTER — Ambulatory Visit (HOSPITAL_BASED_OUTPATIENT_CLINIC_OR_DEPARTMENT_OTHER): Payer: 59 | Attending: Pulmonary Disease

## 2011-03-25 DIAGNOSIS — Z79899 Other long term (current) drug therapy: Secondary | ICD-10-CM | POA: Insufficient documentation

## 2011-03-25 DIAGNOSIS — R5383 Other fatigue: Secondary | ICD-10-CM

## 2011-03-25 DIAGNOSIS — G4733 Obstructive sleep apnea (adult) (pediatric): Secondary | ICD-10-CM | POA: Insufficient documentation

## 2011-04-01 NOTE — Procedures (Signed)
NAME:  George Wise, George Wise NO.:  [id number]  MEDICAL RECORD NO.:  [id number]          PATIENT TYPE:  OUT  LOCATION:  SLEEP CENTER                 FACILITY[facility]  [facility]  PHYSICIAN:  Coralyn Helling, MD        DATE OF BIRTH:  17-Aug-1970  DATE OF STUDY:  03/25/2011                           NOCTURNAL POLYSOMNOGRAM  REFERRING PHYSICIAN:  Coralyn Helling, MD  INDICATION FOR STUDY:  George Wise is a 40 year old male who has a history of hypertension.  He also reports snoring, sleep disruption, and daytime sleepiness.  He is, therefore, referred to the sleep lab for evaluation of hypersomnia with obstructive sleep apnea.  Height is 6 feet 1 inch, weight is 260 pounds, BMI is 35.  Neck size is 16.5 inches.  EPWORTH SLEEPINESS SCORE:  9.  MEDICATIONS:  Nexium, Paxil, lisinopril, and lovastatin.  SLEEP ARCHITECTURE:  The patient followed a split night study protocol during the diagnostic portion of the study.  Total recording time was 184 minutes.  Total sleep time was 134 minutes.  Sleep efficiency was 73%.  Sleep latency was 36 minutes.  REM latency was 96 minutes.  The patient was observed in all stages of sleep and slept exclusively in the non-supine position.  During the titration portion of the study, total recording time was 211 minutes.  Total sleep time was 200 minutes.  Sleep efficiency was 95%. Sleep latency was 1 minute.  REM latency was 92 minutes.  This portion study was notable for the lack of stage III sleep and he slept exclusively in the non-supine position.  RESPIRATORY DATA:  The average respiratory rate was 15.  Loud snoring was noted by the technician.  The overall apnea-hypopnea index was 16.5. The events were exclusively obstructive in nature.  The REM apnea- hypopnea index was 49.9 versus a non-REM apnea-hypopnea index of zero.  During the titration portion of the study, the patient was started on CPAP at 6 and increased to 9 cm of water.  With CPAP set at 8  cm of water, he did have improvement in his sleep disordered breathing and was observed in REM sleep, but not supine sleep.  OXYGEN DATA:  The baseline oxygenation was 93%.  The oxygen saturation nadir was 89%.  The study was conducted without the use of supplemental oxygen.  CARDIAC DATA:  The average heart rate was 71 and the rhythm strip showed sinus rhythm.  MOVEMENT-PARASOMNIA:  The periodic limb movement index was zero and the patient had no restroom trips.  IMPRESSIONS-RECOMMENDATIONS:  This study shows evidence for moderate obstructive sleep apnea with an apnea-hypopnea index of 16.5 and an oxygen saturation nadir of 89%.  He did have a significant rapid eye movement effect to his sleep disordered breathing.  He had good control of his sleep disordered breathing with continuous positive airway pressure set at 8 cm of water.  With this setting, he was observed in rapid eye movement sleep, but not supine sleep.  In addition to diet, exercise, and weight reduction, I would recommend that the patient be started on CPAP at 8 cm of water and monitored for his clinical response.     Coralyn Helling,  MD Diplomat, American Board of Sleep Medicine    VS/MEDQ  D:  04/01/2011 13:09:56  T:  04/01/2011 21:55:54  Job:  [id number]

## 2011-04-02 ENCOUNTER — Telehealth: Payer: Self-pay | Admitting: Pulmonary Disease

## 2011-04-02 ENCOUNTER — Encounter: Payer: Self-pay | Admitting: Pulmonary Disease

## 2011-04-02 DIAGNOSIS — R5383 Other fatigue: Secondary | ICD-10-CM

## 2011-04-02 DIAGNOSIS — G4733 Obstructive sleep apnea (adult) (pediatric): Secondary | ICD-10-CM

## 2011-04-02 DIAGNOSIS — G4761 Periodic limb movement disorder: Secondary | ICD-10-CM

## 2011-04-02 NOTE — Telephone Encounter (Signed)
PSG 03/25/11>>AHI 16.5, SpO2 low 89%, REM AHI 49.9. CPAP 8 cm +R, +S  Will have my nurse schedule ROV to review results.

## 2011-04-02 NOTE — Telephone Encounter (Signed)
[email or web address]  

## 2011-04-03 NOTE — Telephone Encounter (Signed)
Pt called back. Pt scheduled to see VS tomorrow at 2:30 pm

## 2011-04-04 ENCOUNTER — Ambulatory Visit (INDEPENDENT_AMBULATORY_CARE_PROVIDER_SITE_OTHER): Payer: 59 | Admitting: Pulmonary Disease

## 2011-04-04 ENCOUNTER — Encounter: Payer: Self-pay | Admitting: Pulmonary Disease

## 2011-04-04 VITALS — BP 112/72 | HR 81 | Temp 99.0°F | Ht 73.0 in | Wt 266.8 lb

## 2011-04-04 DIAGNOSIS — G4733 Obstructive sleep apnea (adult) (pediatric): Secondary | ICD-10-CM

## 2011-04-04 NOTE — Assessment & Plan Note (Signed)
He has moderate to severe sleep apnea and history of hypertension.  I have reviewed his sleep test results with the patient.  Explained how sleep apnea can affect the patient's health.  Driving precautions and importance of weight loss were discussed.  Treatment options for sleep apnea were reviewed.  Will proceed with CPAP 8 cm H2O.  Advised that he can try using OTC sleep aide if needed while transitioning to use of CPAP.

## 2011-04-04 NOTE — Patient Instructions (Signed)
Will set up CPAP at home Follow up in 2 months 

## 2011-04-04 NOTE — Progress Notes (Signed)
Subjective:    Patient ID: George Wise, male    DOB: 13-Apr-1971, 40 y.o.   MRN: [id number]  HPI CC: Nicholes Mango  40 yo male with OSA.  He is here to follow up PSG 03/25/11>>AHI 16.5, SpO2 low 89%, REM AHI 49.9.  He was then tried on CPAP 8 cm +REM, +Supine sleep.  He has noticed hoarseness over the past few months after recent cold.  He has been using lisinopril for about 2 years.  Past Medical History  Diagnosis Date  . Hypertension   . Hyperlipidemia   . Obesity   . GERD (gastroesophageal reflux disease)   . Glucose intolerance (impaired glucose tolerance)   . Testicular cancer 2001  . OSA (obstructive sleep apnea) 02/17/2011  . OSA (obstructive sleep apnea) 02/17/2011     Family History  Problem Relation Age of Onset  . Diabetes Paternal Grandfather   . Stroke Paternal Grandfather   . Ovarian cancer Paternal Grandmother   . Stomach cancer Paternal Grandmother      History   Social History  . Marital Status: Married    Spouse Name: N/A    Number of Children: 1  . Years of Education: N/A   Occupational History  . truck driver    Social History Main Topics  . Smoking status: Never Smoker   . Alcohol Use: No  . Drug Use: No   No Known Allergies   Outpatient Prescriptions Prior to Visit  Medication Sig Dispense Refill  . lisinopril (PRINIVIL,ZESTRIL) 40 MG tablet Take 1 tablet (40 mg total) by mouth daily.  90 tablet  3  . NEXIUM 40 MG capsule TAKE 1 CAPSULE BY MOUTH TWICE DAILY  60 capsule  3  . PARoxetine (PAXIL) 10 MG tablet Take 10 mg by mouth every morning.         Review of Systems     Objective:   Physical Exam  BP 112/72  Pulse 81  Temp(Src) 99 F (37.2 C) (Oral)  Ht 6\' 1"  (1.854 m)  Wt 266 lb 12.8 oz (121.02 kg)  BMI 35.20 kg/m2  SpO2 95%  General - Obese  HEENT - PERRLA, EOMI, no sinus tenderness, no oral exudate, MP3, no LAN, no thyromegaly  Chest - CTA  Cardiac - s1s2 no murmur, pulses symmetric  Abd - soft, nontender  Ext -  no e/c/c  Neuro - normal strength, CN intact  Psych - normal mood/behavior      Assessment & Plan:   OSA (obstructive sleep apnea) He has moderate to severe sleep apnea and history of hypertension.  I have reviewed his sleep test results with the patient.  Explained how sleep apnea can affect the patient's health.  Driving precautions and importance of weight loss were discussed.  Treatment options for sleep apnea were reviewed.  Will proceed with CPAP 8 cm H2O.  Advised that he can try using OTC sleep aide if needed while transitioning to use of CPAP.    Updated Medication List Outpatient Encounter Prescriptions as of 04/04/2011  Medication Sig Dispense Refill  . lisinopril (PRINIVIL,ZESTRIL) 40 MG tablet Take 1 tablet (40 mg total) by mouth daily.  90 tablet  3  . lovastatin (MEVACOR) 40 MG tablet Take 40 mg by mouth daily.        Marland Kitchen NEXIUM 40 MG capsule TAKE 1 CAPSULE BY MOUTH TWICE DAILY  60 capsule  3  . PARoxetine (PAXIL) 10 MG tablet Take 10 mg by mouth every morning.

## 2011-05-15 HISTORY — PX: TENDON RELEASE: SHX230

## 2011-06-10 ENCOUNTER — Encounter: Payer: Self-pay | Admitting: Pulmonary Disease

## 2011-06-12 ENCOUNTER — Ambulatory Visit (INDEPENDENT_AMBULATORY_CARE_PROVIDER_SITE_OTHER): Payer: 59 | Admitting: Pulmonary Disease

## 2011-06-12 ENCOUNTER — Encounter: Payer: Self-pay | Admitting: Pulmonary Disease

## 2011-06-12 VITALS — BP 120/80 | HR 80 | Temp 98.4°F

## 2011-06-12 DIAGNOSIS — G4733 Obstructive sleep apnea (adult) (pediatric): Secondary | ICD-10-CM

## 2011-06-12 NOTE — Assessment & Plan Note (Signed)
He has done well with CPAP.  He has excellent compliance, and significant improvement after using CPAP.  Advised him to try using bandage over his nasal bridge to avoid irritation from his mask.

## 2011-06-12 NOTE — Progress Notes (Signed)
Chief Complaint  Patient presents with  . Sleep Apnea    follow up    History of Present Illness: George Wise is a 40 y.o. male with OSA.  CPAP 04/11/11 to 05/10/11>>Used on 30 of 30 nights with average 7.6 hrs.  Average AHI 1.1 with CPAP 8 cm H2O.  He has been doing well with CPAP.  He feels this has helped him tremendously.  He goes to bed at 11 pm.  He falls asleep quickly.  He gets out of bed at 7 am.  He feels rested in the morning.  He has a full face mask.  His only difficulty is mild irritation across the bridge of his nose from the mask.  He had surgery for plantar fasciitis recently, and his foot is in a boot.  Past Medical History  Diagnosis Date  . Hypertension   . Hyperlipidemia   . Obesity   . GERD (gastroesophageal reflux disease)   . Glucose intolerance (impaired glucose tolerance)   . Testicular cancer 2001  . OSA (obstructive sleep apnea) 02/17/2011    Past Surgical History  Procedure Date  . Testicle surgery 2001    resection for cancer left  . Lymph node removal 2001    from adb. -20  . Hernia repair   . Tendon release 05/2011    left foot    Current Outpatient Prescriptions on File Prior to Visit  Medication Sig Dispense Refill  . lisinopril (PRINIVIL,ZESTRIL) 40 MG tablet Take 1 tablet (40 mg total) by mouth daily.  90 tablet  3  . NEXIUM 40 MG capsule TAKE 1 CAPSULE BY MOUTH TWICE DAILY  60 capsule  3  . PARoxetine (PAXIL) 10 MG tablet Take 10 mg by mouth every morning.          No Known Allergies  Physical Exam:  Blood pressure 120/80, pulse 80, temperature 98.4 F (36.9 C), temperature source Oral, SpO2 95.00%.  General - Obese  HEENT - PERRLA, EOMI, no sinus tenderness, no oral exudate, MP3, no LAN, no thyromegaly  Chest - CTA  Cardiac - s1s2 no murmur, pulses symmetric  Abd - soft, nontender  Ext - no e/c/c, Lt foot in a boot Neuro - normal strength, CN intact  Psych - normal mood/behavior  Assessment/Plan:  OSA  (obstructive sleep apnea) He has done well with CPAP.  He has excellent compliance, and significant improvement after using CPAP.  Advised him to try using bandage over his nasal bridge to avoid irritation from his mask.     Outpatient Encounter Prescriptions as of 06/12/2011  Medication Sig Dispense Refill  . atorvastatin (LIPITOR) 40 MG tablet Take 40 mg by mouth daily.        Marland Kitchen lisinopril (PRINIVIL,ZESTRIL) 40 MG tablet Take 1 tablet (40 mg total) by mouth daily.  90 tablet  3  . NEXIUM 40 MG capsule TAKE 1 CAPSULE BY MOUTH TWICE DAILY  60 capsule  3  . PARoxetine (PAXIL) 10 MG tablet Take 10 mg by mouth every morning.        Marland Kitchen DISCONTD: lovastatin (MEVACOR) 40 MG tablet Take 40 mg by mouth daily.          Myriam Brandhorst Pager:  [phone] 06/12/2011, 4:46 PM

## 2011-06-12 NOTE — Patient Instructions (Signed)
Try using a bandage over your nasal bridge while using CPAP mask Follow up in 1 year

## 2011-11-24 ENCOUNTER — Other Ambulatory Visit: Payer: Self-pay | Admitting: Internal Medicine

## 2011-12-11 ENCOUNTER — Other Ambulatory Visit: Payer: Self-pay | Admitting: Gastroenterology

## 2011-12-11 ENCOUNTER — Other Ambulatory Visit (HOSPITAL_COMMUNITY): Payer: Self-pay | Admitting: *Deleted

## 2011-12-11 MED ORDER — PAROXETINE HCL 10 MG PO TABS: 10.0000 mg | ORAL_TABLET | ORAL | Status: DC

## 2012-02-02 ENCOUNTER — Encounter (HOSPITAL_COMMUNITY): Payer: Self-pay

## 2012-02-02 ENCOUNTER — Ambulatory Visit (HOSPITAL_COMMUNITY)
Admission: RE | Admit: 2012-02-02 | Discharge: 2012-02-02 | Disposition: A | Discharge status: Home / Self Care | Payer: 59 | Source: Ambulatory Visit | Attending: Internal Medicine | Admitting: Internal Medicine

## 2012-02-02 VITALS — BP 128/62 | HR 63 | Resp 18 | Ht 73.0 in | Wt 277.8 lb

## 2012-02-02 DIAGNOSIS — E7800 Pure hypercholesterolemia, unspecified: Secondary | ICD-10-CM | POA: Insufficient documentation

## 2012-02-02 DIAGNOSIS — Z0289 Encounter for other administrative examinations: Secondary | ICD-10-CM | POA: Insufficient documentation

## 2012-02-02 DIAGNOSIS — K219 Gastro-esophageal reflux disease without esophagitis: Secondary | ICD-10-CM | POA: Insufficient documentation

## 2012-02-02 DIAGNOSIS — Z024 Encounter for examination for driving license: Secondary | ICD-10-CM

## 2012-02-02 DIAGNOSIS — Z8547 Personal history of malignant neoplasm of testis: Secondary | ICD-10-CM | POA: Insufficient documentation

## 2012-02-02 DIAGNOSIS — Z9221 Personal history of antineoplastic chemotherapy: Secondary | ICD-10-CM | POA: Insufficient documentation

## 2012-02-02 DIAGNOSIS — Z9079 Acquired absence of other genital organ(s): Secondary | ICD-10-CM | POA: Insufficient documentation

## 2012-02-02 DIAGNOSIS — G4733 Obstructive sleep apnea (adult) (pediatric): Secondary | ICD-10-CM | POA: Insufficient documentation

## 2012-02-02 DIAGNOSIS — R7301 Impaired fasting glucose: Secondary | ICD-10-CM | POA: Insufficient documentation

## 2012-02-02 DIAGNOSIS — E669 Obesity, unspecified: Secondary | ICD-10-CM | POA: Insufficient documentation

## 2012-02-02 DIAGNOSIS — K589 Irritable bowel syndrome without diarrhea: Secondary | ICD-10-CM | POA: Insufficient documentation

## 2012-02-02 DIAGNOSIS — I10 Essential (primary) hypertension: Secondary | ICD-10-CM | POA: Insufficient documentation

## 2012-02-02 LAB — COMPREHENSIVE METABOLIC PANEL
CO2: 26 mEq/L (ref 19–32)
Calcium: 9.7 mg/dL (ref 8.4–10.5)
Creatinine, Ser: 0.95 mg/dL (ref 0.50–1.35)
GFR calc Af Amer: >90 mL/min (ref >90)
GFR calc non Af Amer: >90 mL/min (ref >90)
Glucose, Bld: 116 mg/dL — ABNORMAL HIGH (ref 70–99)

## 2012-02-02 LAB — CBC
Hemoglobin: 14.9 g/dL (ref 13.0–17.0)
MCH: 32.3 pg (ref 26.0–34.0)
MCV: 91.3 fL (ref 78.0–100.0)
Platelets: 234 10*3/uL (ref 150–400)
RBC: 4.62 MIL/uL (ref 4.22–5.81)

## 2012-02-02 LAB — LIPID PANEL
HDL: 43 mg/dL (ref >39)
Triglycerides: 158 mg/dL — ABNORMAL HIGH (ref <150)

## 2012-02-02 NOTE — Addendum Note (Signed)
Encounter addended by: Noralee Space, RN on: 02/02/2012 10:34 AM<BR>     Documentation filed: Patient Instructions Section, Orders

## 2012-02-02 NOTE — Assessment & Plan Note (Signed)
Encouraged him to start a weight loss program with walking program and diet.

## 2012-02-02 NOTE — Progress Notes (Signed)
Patient ID: George Wise, male   DOB: [birth year], 41 y.o.   MRN: [id number]  HPI:  George Wise is a delightful 41 year old male, who is the husband of George Wise, with a h/o OSA, hypertension, hyperlipidemia, obesity, IBS and glucose intolerance. He also has h/o testicular CA s/p unilateral orchiectomy and chemotherapy ~2001.   Had normal ETT in 2008 with HTN response to exercise.   Presents for DOT physical. Overall doing very well. Feels much better with CPAP.  Remains active with work. Not exercising much - recently had plantar fasciitis surgery.  No problem with CP, dyspnea, orthopnea or PND. Mild ankle edema which resolves overnight. Weight up about 15 pounds. Drinks diet drinks. BP well controlled. No problem with his meds.   ROS: All systems negative except as listed in HPI, PMH and Problem List.  Past Medical History  Diagnosis Date  . Hypertension   . Hyperlipidemia   . Obesity   . GERD (gastroesophageal reflux disease)   . Glucose intolerance (impaired glucose tolerance)   . Testicular cancer 2001  . OSA (obstructive sleep apnea) 02/17/2011    Current Outpatient Prescriptions  Medication Sig Dispense Refill  . atorvastatin (LIPITOR) 40 MG tablet Take 40 mg by mouth daily.        Marland Kitchen lisinopril (PRINIVIL,ZESTRIL) 40 MG tablet TAKE 1 TABLET BY MOUTH DAILY.  90 tablet  1  . NEXIUM 40 MG capsule TAKE 1 CAPSULE BY MOUTH TWICE DAILY  60 capsule  3  . PARoxetine (PAXIL) 10 MG tablet Take 1 tablet (10 mg total) by mouth every morning.  90 tablet  3     PHYSICAL EXAM: Filed Vitals:   02/02/12 0937  BP: 128/62  Pulse: 63  Resp: 18   Weight change:  General:  Well appearing. No resp difficulty HEENT: normal Neck: supple. JVP flat. Carotids 2+ bilaterally; no bruits. No lymphadenopathy or thryomegaly appreciated. Cor: PMI normal. Regular rate & rhythm. No rubs, gallops or murmurs. Lungs: clear Abdomen: soft, nontender, nondistended. No hepatosplenomegaly. No bruits  or masses. Good bowel sounds. Extremities: no cyanosis, clubbing, rash, edema Neuro: alert & orientedx3, cranial nerves grossly intact. Moves all 4 extremities w/o difficulty. Affect pleasant.    ECG: NSR 68 No ST-T wave abnormalities.     ASSESSMENT & PLAN:

## 2012-02-02 NOTE — Assessment & Plan Note (Signed)
Doing great with CPAP. Continue using.

## 2012-02-02 NOTE — Assessment & Plan Note (Signed)
Do for repeat lipids. Will check today. Continue statin. Goal LDL < 100.

## 2012-02-02 NOTE — Assessment & Plan Note (Signed)
Doing well. ECG normal. 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 Given CRFs will repeat exercise test q5 years so he is due for ETT.

## 2012-02-02 NOTE — Assessment & Plan Note (Signed)
Blood pressure well controlled. Continue current regimen.  

## 2012-02-02 NOTE — Patient Instructions (Addendum)
Labs today  Your physician has requested that you have an exercise tolerance test. For further information please visit [email or web address]. Please also follow instruction sheet, as given.  We will contact you in 1 year to schedule your next appointment. 

## 2012-02-03 NOTE — Addendum Note (Signed)
Encounter addended by: Cresenciano Genre on: 02/03/2012  8:01 AM<BR>     Documentation filed: Charges VN

## 2012-02-25 ENCOUNTER — Other Ambulatory Visit: Payer: Self-pay

## 2012-02-25 MED ORDER — ATORVASTATIN CALCIUM 40 MG PO TABS: 40.0000 mg | ORAL_TABLET | Freq: Every day | ORAL | Status: DC

## 2012-02-25 NOTE — Telephone Encounter (Signed)
..   Requested Prescriptions   Pending Prescriptions Disp Refills  . atorvastatin (LIPITOR) 40 MG tablet 90 tablet 2    Sig: Take 1 tablet (40 mg total) by mouth daily.

## 2012-03-03 ENCOUNTER — Encounter: Payer: 59 | Admitting: Nurse Practitioner

## 2012-04-16 ENCOUNTER — Ambulatory Visit: Payer: 59 | Admitting: Pulmonary Disease

## 2012-05-17 ENCOUNTER — Other Ambulatory Visit (HOSPITAL_COMMUNITY): Payer: Self-pay | Admitting: *Deleted

## 2012-05-17 MED ORDER — ESOMEPRAZOLE MAGNESIUM 40 MG PO CPDR: 40.0000 mg | DELAYED_RELEASE_CAPSULE | Freq: Two times a day (BID) | ORAL | Status: DC

## 2012-05-26 ENCOUNTER — Other Ambulatory Visit: Payer: Self-pay | Admitting: Internal Medicine

## 2012-06-16 ENCOUNTER — Telehealth (HOSPITAL_COMMUNITY): Payer: Self-pay | Admitting: *Deleted

## 2012-06-16 MED ORDER — AZITHROMYCIN 250 MG PO TABS: ORAL_TABLET | ORAL | Status: DC

## 2012-06-16 NOTE — Telephone Encounter (Signed)
Per Dr Gala Romney pt needs rx for z-pack, rx called into [facility] in [place], [place]

## 2012-06-18 ENCOUNTER — Encounter: Payer: Self-pay | Admitting: Pulmonary Disease

## 2012-06-18 ENCOUNTER — Ambulatory Visit (INDEPENDENT_AMBULATORY_CARE_PROVIDER_SITE_OTHER): Payer: 59 | Admitting: Pulmonary Disease

## 2012-06-18 VITALS — BP 142/82 | HR 76 | Temp 97.7°F | Ht 73.0 in | Wt 268.8 lb

## 2012-06-18 DIAGNOSIS — G4733 Obstructive sleep apnea (adult) (pediatric): Secondary | ICD-10-CM

## 2012-06-18 NOTE — Patient Instructions (Signed)
Follow-up in one year.

## 2012-06-18 NOTE — Progress Notes (Signed)
  Chief Complaint  Patient presents with  . Follow-up    Pt reports wearing CPAP 6-7 hrs per night, tolerating well--been having some sinus congestion x2-3 days     History of Present Illness: George Wise is a 41 y.o. male with OSA.  He has been doing well with CPAP.  His mask is fitting better.  He gets occasional mouth dryness.  He feels rested during the day.   Tests: PSG 03/25/11>>AHI 16.5, SpO2 low 89%, REM AHI 49.9. CPAP 04/11/11 to 06/17/12>>Used on 431 of 434 nights with average 7 hrs 18 min.  Average AHI 1.2 with CPAP 8 cm H2O.  Past Medical History  Diagnosis Date  . Hypertension   . Hyperlipidemia   . Obesity   . GERD (gastroesophageal reflux disease)   . Glucose intolerance (impaired glucose tolerance)   . Testicular cancer 2001  . OSA (obstructive sleep apnea) 02/17/2011    Past Surgical History  Procedure Date  . Testicle surgery 2001    resection for cancer left  . Lymph node removal 2001    from adb. -20  . Hernia repair   . Tendon release 05/2011    left foot    Current Outpatient Prescriptions on File Prior to Visit  Medication Sig Dispense Refill  . atorvastatin (LIPITOR) 40 MG tablet Take 1 tablet (40 mg total) by mouth daily.  90 tablet  2  . azithromycin (ZITHROMAX) 250 MG tablet Take as directed  6 each  0  . esomeprazole (NEXIUM) 40 MG capsule Take 1 capsule (40 mg total) by mouth 2 (two) times daily before a meal.  180 capsule  3  . lisinopril (PRINIVIL,ZESTRIL) 40 MG tablet TAKE 1 TABLET BY MOUTH DAILY.  90 tablet  1  . PARoxetine (PAXIL) 10 MG tablet Take 1 tablet (10 mg total) by mouth every morning.  90 tablet  3    No Known Allergies   Physical Exam: Filed Vitals:   06/18/12 1608  BP: 142/82  Pulse: 76  Temp: 97.7 F (36.5 C)  TempSrc: Oral  Height: 6\' 1"  (1.854 m)  Weight: 268 lb 12.8 oz (121.927 kg)  SpO2: 96%    Current Encounter SPO2  06/18/12 1608 96%  02/02/12 0937 96%  06/12/11 1623 95%    Wt Readings from  Last 3 Encounters:  06/18/12 268 lb 12.8 oz (121.927 kg)  02/02/12 277 lb 12.8 oz (126.009 kg)  04/04/11 266 lb 12.8 oz (121.02 kg)    Body mass index is 35.46 kg/(m^2).   General - No distress ENT - No sinus tenderness, no oral exudate, 2+ tonsils, MP 3, no LAN Cardiac - s1s2 regular, no murmur Chest - No wheeze/rales/dullness, good air entry, normal respiratory excursion Back - No focal tenderness Abd - Soft, non-tender Ext - No edema Neuro - Normal strength Skin - No rashes Psych - Normal mood, and behavior   Assessment/Plan:  Coralyn Helling, MD [facility] Pager:  [phone] 06/18/2012, 4:24 PM

## 2012-06-18 NOTE — Assessment & Plan Note (Signed)
He is compliant with therapy and reports benefit from CPAP.  He is to continue CPAP 8 cm H2O.  Advised that he should defer flu shot until he is fully recovered from recent upper respiratory infection.

## 2012-11-29 ENCOUNTER — Emergency Department (HOSPITAL_COMMUNITY)
Admission: EM | Admit: 2012-11-29 | Discharge: 2012-11-29 | Disposition: A | Discharge status: Home / Self Care | Payer: 59 | Attending: Emergency Medicine | Admitting: Emergency Medicine

## 2012-11-29 ENCOUNTER — Emergency Department (HOSPITAL_COMMUNITY): Payer: 59

## 2012-11-29 ENCOUNTER — Encounter (HOSPITAL_COMMUNITY): Payer: Self-pay | Admitting: Emergency Medicine

## 2012-11-29 DIAGNOSIS — K219 Gastro-esophageal reflux disease without esophagitis: Secondary | ICD-10-CM | POA: Insufficient documentation

## 2012-11-29 DIAGNOSIS — S60229A Contusion of unspecified hand, initial encounter: Secondary | ICD-10-CM | POA: Insufficient documentation

## 2012-11-29 DIAGNOSIS — Z79899 Other long term (current) drug therapy: Secondary | ICD-10-CM | POA: Insufficient documentation

## 2012-11-29 DIAGNOSIS — Z8547 Personal history of malignant neoplasm of testis: Secondary | ICD-10-CM | POA: Insufficient documentation

## 2012-11-29 DIAGNOSIS — Z8669 Personal history of other diseases of the nervous system and sense organs: Secondary | ICD-10-CM | POA: Insufficient documentation

## 2012-11-29 DIAGNOSIS — Y9289 Other specified places as the place of occurrence of the external cause: Secondary | ICD-10-CM | POA: Insufficient documentation

## 2012-11-29 DIAGNOSIS — Z8639 Personal history of other endocrine, nutritional and metabolic disease: Secondary | ICD-10-CM | POA: Insufficient documentation

## 2012-11-29 DIAGNOSIS — Y9389 Activity, other specified: Secondary | ICD-10-CM | POA: Insufficient documentation

## 2012-11-29 DIAGNOSIS — E669 Obesity, unspecified: Secondary | ICD-10-CM | POA: Insufficient documentation

## 2012-11-29 DIAGNOSIS — W230XXA Caught, crushed, jammed, or pinched between moving objects, initial encounter: Secondary | ICD-10-CM | POA: Insufficient documentation

## 2012-11-29 DIAGNOSIS — E785 Hyperlipidemia, unspecified: Secondary | ICD-10-CM | POA: Insufficient documentation

## 2012-11-29 DIAGNOSIS — I10 Essential (primary) hypertension: Secondary | ICD-10-CM | POA: Insufficient documentation

## 2012-11-29 DIAGNOSIS — Z862 Personal history of diseases of the blood and blood-forming organs and certain disorders involving the immune mechanism: Secondary | ICD-10-CM | POA: Insufficient documentation

## 2012-11-29 DIAGNOSIS — Y990 Civilian activity done for income or pay: Secondary | ICD-10-CM | POA: Insufficient documentation

## 2012-11-29 DIAGNOSIS — S60222A Contusion of left hand, initial encounter: Secondary | ICD-10-CM

## 2012-11-29 MED ORDER — HYDROCODONE-ACETAMINOPHEN 5-325 MG PO TABS
1.0000 | ORAL_TABLET | Freq: Once | ORAL | Status: AC
  Administered 2012-11-29: 1 via ORAL
  Filled 2012-11-29: qty 1

## 2012-11-29 MED ORDER — HYDROCODONE-ACETAMINOPHEN 5-325 MG PO TABS: 1.0000 | ORAL_TABLET | Freq: Four times a day (QID) | ORAL | Status: DC | PRN

## 2012-11-29 MED ORDER — ONDANSETRON 4 MG PO TBDP
4.0000 mg | ORAL_TABLET | Freq: Once | ORAL | Status: AC
  Administered 2012-11-29: 4 mg via ORAL
  Filled 2012-11-29: qty 1

## 2012-11-29 NOTE — ED Notes (Signed)
Patient states that an industrial spring hit his left hand

## 2012-11-29 NOTE — ED Notes (Signed)
Patient was dizzy after the Vicodin.

## 2012-11-29 NOTE — ED Provider Notes (Signed)
History    This chart was scribed for non-physician practitioner, Dorthula Matas PA-C working with Hurman Horn, MD by Donne Anon, ED Scribe. This patient was seen in room WTR6/WTR6 and the patient's care was started at 1921.   CSN: [id number]  Arrival date & time 11/29/12  9629   First MD Initiated Contact with Patient 11/29/12 1921      Chief Complaint  Patient presents with  . Hand Pain     The history is provided by the patient. No language interpreter was used.   HPI Comments: George Wise is a 42 y.o. male who presents to the Emergency Department complaining of sudden onset, constant, gradually worsening, severe left hand pain which began 3 hours PTA when an industrial spring crushed his hand into the floor while he was working on a car. He reports associated swelling and intermittent numbness/tingling in his thumb. He denies numbness or tingling anywhere else in his left hand, wrist pain, or any other pain.no head/neck injury, loc, vomiting. nad vss.   Past Medical History  Diagnosis Date  . Hypertension   . Hyperlipidemia   . Obesity   . GERD (gastroesophageal reflux disease)   . Glucose intolerance (impaired glucose tolerance)   . Testicular cancer 2001  . OSA (obstructive sleep apnea) 02/17/2011    Past Surgical History  Procedure Laterality Date  . Testicle surgery  2001    resection for cancer left  . Lymph node removal  2001    from adb. -20  . Hernia repair    . Tendon release  05/2011    left foot    Family History  Problem Relation Age of Onset  . Diabetes Paternal Grandfather   . Stroke Paternal Grandfather   . Ovarian cancer Paternal Grandmother   . Stomach cancer Paternal Grandmother     History  Substance Use Topics  . Smoking status: Never Smoker   . Smokeless tobacco: Never Used  . Alcohol Use: No      Review of Systems  Constitutional: Negative for fever.  Musculoskeletal: Positive for arthralgias.  All other systems  reviewed and are negative.    Allergies  Review of patient's allergies indicates no known allergies.  Home Medications   Current Outpatient Rx  Name  Route  Sig  Dispense  Refill  . atorvastatin (LIPITOR) 40 MG tablet   Oral   Take 1 tablet (40 mg total) by mouth daily.   90 tablet   2   . esomeprazole (NEXIUM) 40 MG capsule   Oral   Take 1 capsule (40 mg total) by mouth 2 (two) times daily before a meal.   180 capsule   3   . lisinopril (PRINIVIL,ZESTRIL) 40 MG tablet      TAKE 1 TABLET BY MOUTH DAILY.   90 tablet   1   . PARoxetine (PAXIL) 10 MG tablet   Oral   Take 1 tablet (10 mg total) by mouth every morning.   90 tablet   3   . HYDROcodone-acetaminophen (NORCO/VICODIN) 5-325 MG per tablet   Oral   Take 1 tablet by mouth every 6 (six) hours as needed for pain.   12 tablet   0     BP 132/83  Pulse 71  Temp(Src) 98.7 F (37.1 C) (Oral)  Resp 12  Wt 270 lb (122.471 kg)  BMI 35.63 kg/m2  SpO2 100%  Physical Exam  Nursing note and vitals reviewed. Constitutional: He is oriented to person, place,  and time. He appears well-developed and well-nourished. No distress.  HENT:  Head: Normocephalic and atraumatic.  Eyes: EOM are normal.  Neck: Neck supple. No tracheal deviation present.  Cardiovascular: Normal rate.   Pulmonary/Chest: Effort normal. No respiratory distress.  Musculoskeletal: Normal range of motion.       Left hand: He exhibits tenderness and bony tenderness. He exhibits normal range of motion, normal two-point discrimination, normal capillary refill, no deformity, no laceration and no swelling. Normal sensation noted.       Hands: Neurological: He is alert and oriented to person, place, and time.  Skin: Skin is warm and dry.  Psychiatric: He has a normal mood and affect. His behavior is normal.    ED Course  Procedures (including critical care time) DIAGNOSTIC STUDIES: Oxygen Saturation is 100% on room air, normal by my interpretation.     COORDINATION OF CARE: 7:35 PM Discussed treatment plan which includes wrist xray, 4 mg Zofran, and Hydrocodone with pt at bedside and pt agreed to plan. Will discharge home with 5-325 mg Vicodin and wrist splint. Will give referral to hand.  8:36 PM Rechecked pt. Informed of negative xray. Return precautions advised.     Labs Reviewed - No data to display Dg Hand Complete Left  11/29/2012   *RADIOLOGY REPORT*  Clinical Data: Dropped heavy object on left hand today, pain mostly at first and second metacarpals  LEFT HAND - COMPLETE 3+ VIEW  Comparison: None  Findings: Bone mineralization normal. Joint spaces preserved. No fracture, dislocation, or bone destruction.  IMPRESSION: No acute osseous abnormalities.   Original Report Authenticated By: Ulyses Southward, M.D.     1. Hand contusion, left, initial encounter       MDM   Pt has been advised of the symptoms that warrant their return to the ED. Patient has voiced understanding and has agreed to follow-up with the PCP or specialist.  I personally performed the services described in this documentation, which was scribed in my presence. The recorded information has been reviewed and is accurate.      Dorthula Matas, PA-C 11/29/12 2038

## 2012-11-30 ENCOUNTER — Other Ambulatory Visit: Payer: Self-pay | Admitting: Internal Medicine

## 2012-12-03 ENCOUNTER — Other Ambulatory Visit (HOSPITAL_COMMUNITY): Payer: Self-pay | Admitting: *Deleted

## 2012-12-03 MED ORDER — LISINOPRIL 40 MG PO TABS: ORAL_TABLET | ORAL | Status: AC

## 2012-12-06 NOTE — ED Provider Notes (Signed)
Medical screening examination/treatment/procedure(s) were performed by non-physician practitioner and as supervising physician I was immediately available for consultation/collaboration.   Coutney Wildermuth M Jazilyn Siegenthaler, MD 12/06/12 1724 

## 2012-12-10 ENCOUNTER — Other Ambulatory Visit (HOSPITAL_COMMUNITY): Payer: Self-pay | Admitting: Internal Medicine

## 2013-01-19 ENCOUNTER — Ambulatory Visit (HOSPITAL_COMMUNITY): Payer: 59

## 2013-01-20 ENCOUNTER — Ambulatory Visit (HOSPITAL_COMMUNITY): Payer: 59

## 2013-03-10 ENCOUNTER — Other Ambulatory Visit (HOSPITAL_COMMUNITY): Payer: Self-pay | Admitting: *Deleted

## 2013-05-25 ENCOUNTER — Other Ambulatory Visit (HOSPITAL_COMMUNITY): Payer: Self-pay | Admitting: Specialist

## 2013-05-25 DIAGNOSIS — M25511 Pain in right shoulder: Secondary | ICD-10-CM

## 2013-05-26 ENCOUNTER — Other Ambulatory Visit (HOSPITAL_COMMUNITY): Payer: Self-pay | Admitting: Specialist

## 2013-05-26 ENCOUNTER — Ambulatory Visit (HOSPITAL_COMMUNITY)
Admission: RE | Admit: 2013-05-26 | Discharge: 2013-05-26 | Disposition: A | Discharge status: Home / Self Care | Payer: 59 | Source: Ambulatory Visit | Attending: Specialist | Admitting: Specialist

## 2013-05-26 DIAGNOSIS — X58XXXA Exposure to other specified factors, initial encounter: Secondary | ICD-10-CM | POA: Insufficient documentation

## 2013-05-26 DIAGNOSIS — S43499A Other sprain of unspecified shoulder joint, initial encounter: Secondary | ICD-10-CM | POA: Insufficient documentation

## 2013-05-26 DIAGNOSIS — M67919 Unspecified disorder of synovium and tendon, unspecified shoulder: Secondary | ICD-10-CM | POA: Insufficient documentation

## 2013-05-26 DIAGNOSIS — IMO0002 Reserved for concepts with insufficient information to code with codable children: Secondary | ICD-10-CM

## 2013-05-26 DIAGNOSIS — M25511 Pain in right shoulder: Secondary | ICD-10-CM

## 2013-05-26 DIAGNOSIS — M719 Bursopathy, unspecified: Secondary | ICD-10-CM | POA: Insufficient documentation

## 2013-05-26 DIAGNOSIS — S43439A Superior glenoid labrum lesion of unspecified shoulder, initial encounter: Secondary | ICD-10-CM | POA: Insufficient documentation

## 2013-05-26 DIAGNOSIS — M25519 Pain in unspecified shoulder: Secondary | ICD-10-CM | POA: Insufficient documentation

## 2013-05-26 DIAGNOSIS — Z1389 Encounter for screening for other disorder: Secondary | ICD-10-CM | POA: Insufficient documentation

## 2013-06-07 ENCOUNTER — Other Ambulatory Visit (HOSPITAL_COMMUNITY): Payer: Self-pay | Admitting: *Deleted

## 2013-06-07 MED ORDER — ESOMEPRAZOLE MAGNESIUM 40 MG PO CPDR: 40.0000 mg | DELAYED_RELEASE_CAPSULE | Freq: Two times a day (BID) | ORAL | Status: DC

## 2013-07-05 ENCOUNTER — Telehealth: Payer: Self-pay | Admitting: Pulmonary Disease

## 2013-07-05 NOTE — Telephone Encounter (Signed)
called patient and lm x3. sent letter 12/23

## 2013-09-11 HISTORY — PX: PLANTAR FASCIA RELEASE: SHX2239

## 2013-09-19 ENCOUNTER — Other Ambulatory Visit (HOSPITAL_COMMUNITY): Payer: Self-pay | Admitting: Internal Medicine

## 2013-10-05 ENCOUNTER — Ambulatory Visit: Payer: 59 | Admitting: Pulmonary Disease

## 2013-10-05 ENCOUNTER — Ambulatory Visit (INDEPENDENT_AMBULATORY_CARE_PROVIDER_SITE_OTHER): Payer: 59 | Admitting: Pulmonary Disease

## 2013-10-05 ENCOUNTER — Encounter: Payer: Self-pay | Admitting: Pulmonary Disease

## 2013-10-05 VITALS — BP 146/90 | HR 84 | Temp 99.2°F | Ht 73.0 in | Wt 284.4 lb

## 2013-10-05 DIAGNOSIS — G4733 Obstructive sleep apnea (adult) (pediatric): Secondary | ICD-10-CM

## 2013-10-05 NOTE — Assessment & Plan Note (Signed)
He has been compliant with CPAP and reports benefit.  I am concerned he has CPAP malfunction causing current difficulties.  Will have his DME assess his CPAP set up, and then send copy of his CPAP download.

## 2013-10-05 NOTE — Patient Instructions (Signed)
Will have your home care company assess your CPAP machine Will get copy of your CPAP report Follow up in 1 year

## 2013-10-05 NOTE — Progress Notes (Signed)
  Chief Complaint  Patient presents with  . Follow-up    Pt states he has been sleeping but for the past 5 days his machine has been getting really hot and running half pressure, "messing up". Pt has to take mask off d/t the temp being too hot.     History of Present Illness: George Wise is a 43 y.o. male with OSA.  I last saw him in December 2013.  He has been using CPAP.  His machine has been getting hot, and more difficult to use.  He does not feel like it is delivering pressure appropriately.  He was doing well with CPAP prior to this.  He has full face mask and no problem with his mask.  Tests: PSG 03/25/11>>AHI 16.5, SpO2 low 89%, REM AHI 49.9. CPAP 04/11/11 to 06/17/12>>Used on 431 of 434 nights with average 7 hrs 18 min.  Average AHI 1.2 with CPAP 8 cm H2O.  He  has a past medical history of Hypertension; Hyperlipidemia; Obesity; GERD (gastroesophageal reflux disease); Glucose intolerance (impaired glucose tolerance); Testicular cancer (2001); and OSA (obstructive sleep apnea) (02/17/2011).  He  has past surgical history that includes Testicle surgery (2001); lymph node removal (2001); Hernia repair; Tendon release (05/2011); and Plantar fascia release (09/2013).   Current Outpatient Prescriptions on File Prior to Visit  Medication Sig Dispense Refill  . atorvastatin (LIPITOR) 40 MG tablet TAKE 1 TABLET BY MOUTH DAILY.  90 tablet  3  . lisinopril (PRINIVIL,ZESTRIL) 40 MG tablet TAKE 1 TABLET BY MOUTH DAILY.  90 tablet  3  . NEXIUM 40 MG capsule TAKE 1 CAPSULE BY MOUTH 2 TIMES DAILY BEFORE A MEAL  180 capsule  3  . PARoxetine (PAXIL) 10 MG tablet TAKE 1 TABLET BY MOUTH EVERY MORNING.  90 tablet  3  . HYDROcodone-acetaminophen (NORCO/VICODIN) 5-325 MG per tablet Take 1 tablet by mouth every 6 (six) hours as needed for pain.  12 tablet  0   No current facility-administered medications on file prior to visit.    No Known Allergies   Physical Exam: Filed Vitals:   10/05/13  1548  BP: 146/90  Pulse: 84  Temp: 99.2 F (37.3 C)  TempSrc: Oral  Height: 6\' 1"  (1.854 m)  Weight: 284 lb 6.4 oz (129.003 kg)  SpO2: 95%    Current Encounter SPO2  10/05/13 1548 95%  11/29/12 1919 100%  06/18/12 1608 96%    Wt Readings from Last 3 Encounters:  10/05/13 284 lb 6.4 oz (129.003 kg)  11/29/12 270 lb (122.471 kg)  06/18/12 268 lb 12.8 oz (121.927 kg)    Body mass index is 37.53 kg/(m^2).   General - No distress ENT - No sinus tenderness, no oral exudate, 2+ tonsils, MP 3, no LAN Cardiac - s1s2 regular, no murmur Chest - No wheeze/rales/dullness, good air entry, normal respiratory excursion Back - No focal tenderness Abd - Soft, non-tender Ext - No edema Neuro - Normal strength Skin - No rashes Psych - Normal mood, and behavior   Assessment/Plan:  Chesley Mires, MD [facility] Pager:  [phone] 10/05/2013, 3:58 PM

## 2013-10-24 ENCOUNTER — Ambulatory Visit: Payer: 59 | Admitting: Pulmonary Disease

## 2014-09-25 ENCOUNTER — Emergency Department (HOSPITAL_COMMUNITY)
Admission: EM | Admit: 2014-09-25 | Discharge: 2014-09-26 | Disposition: A | Discharge status: Home / Self Care | Payer: 59 | Attending: Emergency Medicine | Admitting: Emergency Medicine

## 2014-09-25 ENCOUNTER — Encounter (HOSPITAL_COMMUNITY): Payer: Self-pay | Admitting: Emergency Medicine

## 2014-09-25 ENCOUNTER — Emergency Department (HOSPITAL_COMMUNITY): Payer: 59

## 2014-09-25 DIAGNOSIS — R509 Fever, unspecified: Secondary | ICD-10-CM | POA: Insufficient documentation

## 2014-09-25 DIAGNOSIS — E785 Hyperlipidemia, unspecified: Secondary | ICD-10-CM | POA: Diagnosis not present

## 2014-09-25 DIAGNOSIS — Z8719 Personal history of other diseases of the digestive system: Secondary | ICD-10-CM | POA: Insufficient documentation

## 2014-09-25 DIAGNOSIS — R52 Pain, unspecified: Secondary | ICD-10-CM

## 2014-09-25 DIAGNOSIS — R51 Headache: Secondary | ICD-10-CM | POA: Insufficient documentation

## 2014-09-25 DIAGNOSIS — Z792 Long term (current) use of antibiotics: Secondary | ICD-10-CM | POA: Insufficient documentation

## 2014-09-25 DIAGNOSIS — I10 Essential (primary) hypertension: Secondary | ICD-10-CM | POA: Diagnosis not present

## 2014-09-25 DIAGNOSIS — Z79899 Other long term (current) drug therapy: Secondary | ICD-10-CM | POA: Diagnosis not present

## 2014-09-25 DIAGNOSIS — Z8547 Personal history of malignant neoplasm of testis: Secondary | ICD-10-CM | POA: Diagnosis not present

## 2014-09-25 DIAGNOSIS — E669 Obesity, unspecified: Secondary | ICD-10-CM | POA: Insufficient documentation

## 2014-09-25 DIAGNOSIS — R519 Headache, unspecified: Secondary | ICD-10-CM

## 2014-09-25 DIAGNOSIS — Z8669 Personal history of other diseases of the nervous system and sense organs: Secondary | ICD-10-CM | POA: Insufficient documentation

## 2014-09-25 LAB — CBC WITH DIFFERENTIAL/PLATELET
Basophils Absolute: 0.2 10*3/uL — ABNORMAL HIGH (ref 0.0–0.1)
Basophils Relative: 3 % — ABNORMAL HIGH (ref 0–1)
EOS PCT: 1 % (ref 0–5)
Eosinophils Absolute: 0.1 10*3/uL (ref 0.0–0.7)
HEMATOCRIT: 38.7 % — AB (ref 39.0–52.0)
HEMOGLOBIN: 13.4 g/dL (ref 13.0–17.0)
LYMPHS ABS: 2.3 10*3/uL (ref 0.7–4.0)
Lymphocytes Relative: 41 % (ref 12–46)
MCH: 31 pg (ref 26.0–34.0)
MCHC: 34.6 g/dL (ref 30.0–36.0)
MCV: 89.6 fL (ref 78.0–100.0)
MONO ABS: 0.8 10*3/uL (ref 0.1–1.0)
Monocytes Relative: 14 % — ABNORMAL HIGH (ref 3–12)
Neutro Abs: 2.3 10*3/uL (ref 1.7–7.7)
Neutrophils Relative %: 41 % — ABNORMAL LOW (ref 43–77)
PLATELETS: 136 10*3/uL — AB (ref 150–400)
RBC: 4.32 MIL/uL (ref 4.22–5.81)
RDW: 12.6 % (ref 11.5–15.5)
WBC: 5.7 10*3/uL (ref 4.0–10.5)

## 2014-09-25 LAB — COMPREHENSIVE METABOLIC PANEL
ALBUMIN: 3.6 g/dL (ref 3.5–5.2)
ALK PHOS: 182 U/L — AB (ref 39–117)
ALT: 143 U/L — ABNORMAL HIGH (ref 0–53)
AST: 77 U/L — ABNORMAL HIGH (ref 0–37)
Anion gap: 10 (ref 5–15)
BUN: 13 mg/dL (ref 6–23)
CO2: 28 mmol/L (ref 19–32)
CREATININE: 1.09 mg/dL (ref 0.50–1.35)
Calcium: 9.4 mg/dL (ref 8.4–10.5)
Chloride: 95 mmol/L — ABNORMAL LOW (ref 96–112)
GFR calc Af Amer: >90 mL/min (ref >90)
GFR calc non Af Amer: 82 mL/min — ABNORMAL LOW (ref >90)
Glucose, Bld: 239 mg/dL — ABNORMAL HIGH (ref 70–99)
POTASSIUM: 3.8 mmol/L (ref 3.5–5.1)
Sodium: 133 mmol/L — ABNORMAL LOW (ref 135–145)
TOTAL PROTEIN: 7.3 g/dL (ref 6.0–8.3)
Total Bilirubin: 1.3 mg/dL — ABNORMAL HIGH (ref 0.3–1.2)

## 2014-09-25 LAB — URINALYSIS, ROUTINE W REFLEX MICROSCOPIC
BILIRUBIN URINE: NEGATIVE
Hgb urine dipstick: NEGATIVE
KETONES UR: 15 mg/dL — AB
Leukocytes, UA: NEGATIVE
NITRITE: NEGATIVE
PH: 6 (ref 5.0–8.0)
Protein, ur: NEGATIVE mg/dL
SPECIFIC GRAVITY, URINE: 1.035 — AB (ref 1.005–1.030)
Urobilinogen, UA: 1 mg/dL (ref 0.0–1.0)

## 2014-09-25 LAB — I-STAT CG4 LACTIC ACID, ED: LACTIC ACID, VENOUS: 1.24 mmol/L (ref 0.5–2.0)

## 2014-09-25 LAB — RAPID STREP SCREEN (MED CTR MEBANE ONLY): Streptococcus, Group A Screen (Direct): NEGATIVE

## 2014-09-25 LAB — URINE MICROSCOPIC-ADD ON

## 2014-09-25 MED ORDER — FENTANYL CITRATE 0.05 MG/ML IJ SOLN
50.0000 ug | Freq: Once | INTRAMUSCULAR | Status: AC
  Administered 2014-09-25: 50 ug via INTRAVENOUS
  Filled 2014-09-25: qty 2

## 2014-09-25 MED ORDER — LIDOCAINE HCL 2 % IJ SOLN
20.0000 mL | Freq: Once | INTRAMUSCULAR | Status: AC
  Administered 2014-09-25: 400 mg via INTRADERMAL
  Filled 2014-09-25: qty 20

## 2014-09-25 MED ORDER — SODIUM CHLORIDE 0.9 % IV BOLUS (SEPSIS)
1000.0000 mL | Freq: Once | INTRAVENOUS | Status: AC
  Administered 2014-09-26: 1000 mL via INTRAVENOUS

## 2014-09-25 MED ORDER — SODIUM CHLORIDE 0.9 % IV BOLUS (SEPSIS)
1000.0000 mL | Freq: Once | INTRAVENOUS | Status: AC
  Administered 2014-09-25: 1000 mL via INTRAVENOUS

## 2014-09-25 MED ORDER — METOCLOPRAMIDE HCL 5 MG/ML IJ SOLN
10.0000 mg | Freq: Once | INTRAMUSCULAR | Status: AC
  Administered 2014-09-25: 10 mg via INTRAVENOUS
  Filled 2014-09-25: qty 2

## 2014-09-25 MED ORDER — ONDANSETRON HCL 4 MG/2ML IJ SOLN
4.0000 mg | Freq: Once | INTRAMUSCULAR | Status: AC
  Administered 2014-09-25: 4 mg via INTRAVENOUS
  Filled 2014-09-25: qty 2

## 2014-09-25 MED ORDER — MORPHINE SULFATE 4 MG/ML IJ SOLN
4.0000 mg | Freq: Once | INTRAMUSCULAR | Status: AC
  Administered 2014-09-25: 4 mg via INTRAVENOUS
  Filled 2014-09-25: qty 1

## 2014-09-25 MED ORDER — ONDANSETRON HCL 4 MG/2ML IJ SOLN
4.0000 mg | Freq: Once | INTRAMUSCULAR | Status: AC
  Administered 2014-09-26: 4 mg via INTRAVENOUS
  Filled 2014-09-25: qty 2

## 2014-09-25 NOTE — ED Provider Notes (Signed)
CSN: [id number]     Arrival date & time 09/25/14  1839 History  This chart was scribed for non-physician practitioner, George Wise working with George Johns, MD by George Wise, ED Scribe. This patient was seen in room TR09C/TR09C and the patient's care was started at 7:28 PM.     Chief Complaint  Patient presents with  . Influenza  . Generalized Body Aches   The history is provided by the patient. No language interpreter was used.   HPI Comments: George Wise is a 44 y.o. male with a history of HTN, hyperlipidemia and GERD who presents to the Emergency Department complaining of arthralgias and myaglias, fever up to 101 at home with HA and fatigue. He has also had nausea and loss of appetite that started 5 days ago. The pt reports being outside in the woods recently for his job but denies any bug or tick bites. He denies being around anybody with similar symptoms. The pt has taken tylenol and ibuprofen at home with transient relief but then his symptoms returned. Pt denies any rashes, vomiting, diarrhea, cough, sore throat, ear pain, dizziness, or change in vision and dysuria.   Past Medical History  Diagnosis Date  . Hypertension   . Hyperlipidemia   . Obesity   . GERD (gastroesophageal reflux disease)   . Glucose intolerance (impaired glucose tolerance)   . Testicular cancer 2001  . OSA (obstructive sleep apnea) 02/17/2011   Past Surgical History  Procedure Laterality Date  . Testicle surgery  2001    resection for cancer left  . Lymph node removal  2001    from adb. -20  . Hernia repair    . Tendon release  05/2011    left foot  . Plantar fascia release  09/2013   Family History  Problem Relation Age of Onset  . Diabetes Paternal Grandfather   . Stroke Paternal Grandfather   . Ovarian cancer Paternal Grandmother   . Stomach cancer Paternal Grandmother    History  Substance Use Topics  . Smoking status: Never Smoker   . Smokeless tobacco: Never Used  . Alcohol  Use: No    Review of Systems  Constitutional: Positive for fever, chills and fatigue.  HENT: Negative for ear pain.   Eyes: Negative for visual disturbance.  Gastrointestinal: Positive for nausea.  Musculoskeletal: Positive for myalgias and arthralgias.  Neurological: Positive for headaches. Negative for dizziness.  All other systems reviewed and are negative.     Allergies  Review of patient's allergies indicates no known allergies.  Home Medications   Prior to Admission medications   Medication Sig Start Date End Date Taking? Authorizing Provider  atorvastatin (LIPITOR) 40 MG tablet TAKE 1 TABLET BY MOUTH DAILY. 11/30/12   George Artist, MD  doxycycline (VIBRAMYCIN) 100 MG capsule Take 1 capsule (100 mg total) by mouth 2 (two) times daily. 09/26/14   George Haring, PA-C  HYDROcodone-acetaminophen (NORCO/VICODIN) 5-325 MG per tablet Take 1 tablet by mouth every 6 (six) hours as needed for pain. 11/29/12   George Haring, PA-C  HYDROcodone-acetaminophen (NORCO/VICODIN) 5-325 MG per tablet Take 1-2 tablets by mouth every 4 (four) hours as needed. 09/26/14   George Smelcer Carlota Raspberry, PA-C  lisinopril (PRINIVIL,ZESTRIL) 40 MG tablet TAKE 1 TABLET BY MOUTH DAILY. 12/03/12   George Artist, MD  NEXIUM 40 MG capsule TAKE 1 CAPSULE BY MOUTH 2 TIMES DAILY BEFORE A MEAL 09/19/13   George Artist, MD  ondansetron (ZOFRAN) 4 MG tablet Take 1 tablet (  4 mg total) by mouth every 6 (six) hours. 09/26/14   George Fabiano Carlota Raspberry, PA-C  PARoxetine (PAXIL) 10 MG tablet TAKE 1 TABLET BY MOUTH EVERY MORNING. 12/10/12   George Artist, MD   BP 125/68 mmHg  Pulse 84  Temp(Src) 98.7 F (37.1 C) (Oral)  Resp 24  Ht 6' 1.5" (1.867 m)  Wt 298 lb (135.172 kg)  BMI 38.78 kg/m2  SpO2 100% Physical Exam  Constitutional: He is oriented to person, place, and time. He appears well-developed and well-nourished.  HENT:  Head: Normocephalic and atraumatic.  Eyes: Right eye exhibits no discharge. Left eye exhibits no  discharge.  Neck: Neck supple. No tracheal deviation present.  Mild paraspinal cervical tenderness.   Cardiovascular: Normal rate.   Pulmonary/Chest: Effort normal and breath sounds normal. No respiratory distress. He has no wheezes. He has no rales.  Abdominal: He exhibits no distension and no mass. There is no tenderness. There is no rebound and no guarding.  Musculoskeletal:  No lower extremity swelling. No joint swelling or TTP of back.   Neurological: He is alert and oriented to person, place, and time.  Skin: Skin is warm and dry. No rash noted.  Psychiatric: He has a normal mood and affect.  Nursing note and vitals reviewed.   ED Course  LUMBAR PUNCTURE Date/Time: 09/26/2014 1:37 AM Performed by: George Wise Authorized by: George Wise Consent: Verbal consent obtained. Risks and benefits: risks, benefits and alternatives were discussed Consent given by: patient and spouse Patient understanding: patient states understanding of the procedure being performed Patient consent: the patient's understanding of the procedure matches consent given Patient identity confirmed: verbally with patient and arm band Time out: Immediately prior to procedure a "time out" was called to verify the correct patient, procedure, equipment, support staff and site/side marked as required. Indications: evaluation for infection Anesthesia: local infiltration Local anesthetic: lidocaine 1% without epinephrine Anesthetic total: 5 ml Preparation: Patient was prepped and draped in the usual sterile fashion. Lumbar space: L3-L4 interspace Patient's position: sitting Needle gauge: 22 Needle type: spinal needle - Quincke tip Needle length: 3.5 in Number of attempts: 2 Opening pressure: 15 cm H2O Closing pressure: 19 cm H2O Fluid appearance: clear Tubes of fluid: 4 Total volume: 6 ml Post-procedure: site cleaned, pressure dressing applied and adhesive bandage applied Patient tolerance: Patient  tolerated the procedure well with no immediate complications     DIAGNOSTIC STUDIES: Oxygen Saturation is 97% on RA, normal by my interpretation.    COORDINATION OF CARE: 7:32 PM Discussed treatment plan with pt at bedside and pt agreed to plan.  9:37 PM Discussed pt with Dr. Tamera Punt.   Labs Review Labs Reviewed  CBC WITH DIFFERENTIAL/PLATELET - Abnormal; Notable for the following:    HCT 38.7 (*)    Platelets 136 (*)    Neutrophils Relative % 41 (*)    Monocytes Relative 14 (*)    Basophils Relative 3 (*)    Basophils Absolute 0.2 (*)    All other components within normal limits  COMPREHENSIVE METABOLIC PANEL - Abnormal; Notable for the following:    Sodium 133 (*)    Chloride 95 (*)    Glucose, Bld 239 (*)    AST 77 (*)    ALT 143 (*)    Alkaline Phosphatase 182 (*)    Total Bilirubin 1.3 (*)    GFR calc non Af Amer 82 (*)    All other components within normal limits  URINALYSIS, ROUTINE W REFLEX MICROSCOPIC - Abnormal;  Notable for the following:    Color, Urine AMBER (*)    Specific Gravity, Urine 1.035 (*)    Glucose, UA >1000 (*)    Ketones, ur 15 (*)    All other components within normal limits  CSF CELL COUNT WITH DIFFERENTIAL - Abnormal; Notable for the following:    RBC Count, CSF 7 (*)    All other components within normal limits  GLUCOSE, CSF - Abnormal; Notable for the following:    Glucose, CSF 147 (*)    All other components within normal limits  RAPID STREP SCREEN  GRAM STAIN  CULTURE, GROUP A STREP  CSF CULTURE  CSF CELL COUNT WITH DIFFERENTIAL  PROTEIN, CSF  URINE MICROSCOPIC-ADD ON  ROCKY MTN SPOTTED FVR ABS PNL(IGG+IGM)  B. BURGDORFI ANTIBODIES  I-STAT CG4 LACTIC ACID, ED  I-STAT CG4 LACTIC ACID, ED    Imaging Review Dg Chest 2 View  09/25/2014   CLINICAL DATA:  Generalized body aches, fever, chills  EXAM: CHEST  2 VIEW  COMPARISON:  None.  FINDINGS: Lungs are clear.  No pleural effusion or pneumothorax.  The heart is normal in size.   Visualized osseous structures are within normal limits.  IMPRESSION: No evidence of acute cardiopulmonary disease.   Electronically Signed   By: Julian Hy M.D.   On: 09/25/2014 20:52     EKG Interpretation None      MDM   Final diagnoses:  Fever  Nonintractable headache, unspecified chronicity pattern, unspecified headache type  Body aches   The patient has fever with headache and neck pain and fatigue. He has no history of all, rhinorrhea, ear pain, nausea, vomiting, diarrhea, chest pain, abdominal pain, dysuria. She has had fever up to 101 at home and has a fever of 100 in the ER. Discussed case with Dr. Tamera Punt who recommends lumbar puncture. Due to no history of recurrent complaints of neurological deficit she does not recommend head CT.  Lumbar puncture assisted by Dr. Jola Schmidt.   Medications  sodium chloride 0.9 % bolus 1,000 mL (not administered)  ondansetron (ZOFRAN) injection 4 mg (not administered)  doxycycline (VIBRA-TABS) tablet 100 mg (not administered)  sodium chloride 0.9 % bolus 1,000 mL (0 mLs Intravenous Stopped 09/25/14 2130)  morphine 4 MG/ML injection 4 mg (4 mg Intravenous Given 09/25/14 2016)  ondansetron (ZOFRAN) injection 4 mg (4 mg Intravenous Given 09/25/14 2016)  sodium chloride 0.9 % bolus 1,000 mL (0 mLs Intravenous Stopped 09/25/14 2314)  fentaNYL (SUBLIMAZE) injection 50 mcg (50 mcg Intravenous Given 09/25/14 2125)  metoCLOPramide (REGLAN) injection 10 mg (10 mg Intravenous Given 09/25/14 2125)  lidocaine (XYLOCAINE) 2 % (with pres) injection 400 mg (400 mg Intradermal Given 09/25/14 2240)    Treatment in the ER the patient has been feeling significantly better. He has a negative lactic acid 2 and his lumbar puncture does not show any acute findings. His CBC and urinalysis are unremarkable. His CMP does show a bump in his liver enzymes which can be seen in viral illness and this will need to be rechecked by his primary care provider. He has had a  negative strep throat. Patient does report a history of being in the woods recently over the past couple of weeks the temperature has warmed up and takes had been seen there for Lyme disease and [facility] spotted titer has been ordered and are pending. No abnormality on chest x-ray.  Discussing case with Dr. Reather Converse patient will be treated with Zofran, doxycycline and pain medication.  He will see his primary care provider on Wednesday or Thursday for follow-up. Pt appears non toxic and looks better and reports feeling significantly better.  44 y.o.Payton Mccallum Underhill's evaluation in the Emergency Department is complete. It has been determined that no acute conditions requiring further emergency intervention are present at this time. The patient/guardian have been advised of the diagnosis and plan. We have discussed signs and symptoms that warrant return to the ED, such as changes or worsening in symptoms.  Vital signs are stable at discharge. Filed Vitals:   09/25/14 2159  BP: 125/68  Pulse: 84  Temp: 98.7 F (37.1 C)  Resp: 24    Patient/guardian has voiced understanding and agreed to follow-up with the PCP or specialist.   I personally performed the services described in this documentation, which was scribed in my presence. The recorded information has been reviewed and is accurate.       George Haring, PA-C 09/26/14 [place], PA-C 09/26/14 0630  George Johns, MD 09/28/14 [phone]

## 2014-09-25 NOTE — ED Notes (Signed)
Patient transported to X-ray 

## 2014-09-25 NOTE — ED Notes (Signed)
Pt states he is been sick with generalized body ache 8/10 since last Wednesday, having fever, chills and nausea.

## 2014-09-26 LAB — CSF CELL COUNT WITH DIFFERENTIAL
EOS CSF: NONE SEEN % (ref 0–1)
EOS CSF: NONE SEEN % (ref 0–1)
RBC Count, CSF: 0 /mm3
RBC Count, CSF: 7 /mm3 — ABNORMAL HIGH
SEGMENTED NEUTROPHILS-CSF: NONE SEEN % (ref 0–6)
SEGMENTED NEUTROPHILS-CSF: NONE SEEN % (ref 0–6)
TUBE #: 1
Tube #: 4
WBC CSF: 2 /mm3 (ref 0–5)
WBC, CSF: 1 /mm3 (ref 0–5)

## 2014-09-26 LAB — GLUCOSE, CSF: GLUCOSE CSF: 147 mg/dL — AB (ref 43–76)

## 2014-09-26 LAB — I-STAT CG4 LACTIC ACID, ED: LACTIC ACID, VENOUS: 1.42 mmol/L (ref 0.5–2.0)

## 2014-09-26 LAB — GRAM STAIN

## 2014-09-26 LAB — PROTEIN, CSF: Total  Protein, CSF: 39 mg/dL (ref 15–45)

## 2014-09-26 MED ORDER — DOXYCYCLINE HYCLATE 100 MG PO CAPS: 100.0000 mg | ORAL_CAPSULE | Freq: Two times a day (BID) | ORAL | Status: DC

## 2014-09-26 MED ORDER — DOXYCYCLINE HYCLATE 100 MG PO TABS
100.0000 mg | ORAL_TABLET | Freq: Once | ORAL | Status: AC
  Administered 2014-09-26: 100 mg via ORAL
  Filled 2014-09-26: qty 1

## 2014-09-26 MED ORDER — ONDANSETRON HCL 4 MG PO TABS: 4.0000 mg | ORAL_TABLET | Freq: Four times a day (QID) | ORAL | Status: DC

## 2014-09-26 MED ORDER — HYDROCODONE-ACETAMINOPHEN 5-325 MG PO TABS: 1.0000 | ORAL_TABLET | ORAL | Status: DC | PRN

## 2014-09-26 NOTE — Discharge Instructions (Signed)

## 2014-09-27 LAB — B. BURGDORFI ANTIBODIES: B burgdorferi Ab IgG+IgM: <0.91 {ISR} (ref 0.00–0.90)

## 2014-09-27 LAB — ROCKY MTN SPOTTED FVR ABS PNL(IGG+IGM)
RMSF IGG: NEGATIVE
RMSF IgM: 0.24 index (ref 0.00–0.89)

## 2014-09-28 LAB — CULTURE, GROUP A STREP: STREP A CULTURE: NEGATIVE

## 2014-09-29 LAB — CSF CULTURE

## 2014-09-29 LAB — CSF CULTURE W GRAM STAIN: Culture: NO GROWTH

## 2015-04-07 DIAGNOSIS — E785 Hyperlipidemia, unspecified: Secondary | ICD-10-CM | POA: Diagnosis not present

## 2015-04-07 DIAGNOSIS — I10 Essential (primary) hypertension: Secondary | ICD-10-CM | POA: Diagnosis not present

## 2015-04-07 DIAGNOSIS — Z8669 Personal history of other diseases of the nervous system and sense organs: Secondary | ICD-10-CM | POA: Diagnosis not present

## 2015-04-07 DIAGNOSIS — Z79899 Other long term (current) drug therapy: Secondary | ICD-10-CM | POA: Insufficient documentation

## 2015-04-07 DIAGNOSIS — K219 Gastro-esophageal reflux disease without esophagitis: Secondary | ICD-10-CM | POA: Diagnosis not present

## 2015-04-07 DIAGNOSIS — E669 Obesity, unspecified: Secondary | ICD-10-CM | POA: Diagnosis not present

## 2015-04-07 DIAGNOSIS — K429 Umbilical hernia without obstruction or gangrene: Secondary | ICD-10-CM | POA: Insufficient documentation

## 2015-04-07 DIAGNOSIS — Z792 Long term (current) use of antibiotics: Secondary | ICD-10-CM | POA: Insufficient documentation

## 2015-04-07 DIAGNOSIS — R1031 Right lower quadrant pain: Secondary | ICD-10-CM | POA: Diagnosis present

## 2015-04-07 DIAGNOSIS — Z8547 Personal history of malignant neoplasm of testis: Secondary | ICD-10-CM | POA: Insufficient documentation

## 2015-04-08 ENCOUNTER — Emergency Department (HOSPITAL_COMMUNITY)
Admission: EM | Admit: 2015-04-08 | Discharge: 2015-04-08 | Disposition: A | Discharge status: Home / Self Care | Payer: 59 | Attending: Emergency Medicine | Admitting: Emergency Medicine

## 2015-04-08 ENCOUNTER — Encounter (HOSPITAL_COMMUNITY): Payer: Self-pay | Admitting: Emergency Medicine

## 2015-04-08 DIAGNOSIS — K429 Umbilical hernia without obstruction or gangrene: Secondary | ICD-10-CM

## 2015-04-08 LAB — COMPREHENSIVE METABOLIC PANEL
ALBUMIN: 4.4 g/dL (ref 3.5–5.0)
ALT: 28 U/L (ref 17–63)
ANION GAP: 9 (ref 5–15)
AST: 36 U/L (ref 15–41)
Alkaline Phosphatase: 80 U/L (ref 38–126)
BILIRUBIN TOTAL: 0.7 mg/dL (ref 0.3–1.2)
BUN: 18 mg/dL (ref 6–20)
CHLORIDE: 100 mmol/L — AB (ref 101–111)
CO2: 30 mmol/L (ref 22–32)
Calcium: 9.9 mg/dL (ref 8.9–10.3)
Creatinine, Ser: 1.17 mg/dL (ref 0.61–1.24)
GFR calc Af Amer: >60 mL/min (ref >60)
GFR calc non Af Amer: >60 mL/min (ref >60)
GLUCOSE: 107 mg/dL — AB (ref 65–99)
POTASSIUM: 3.7 mmol/L (ref 3.5–5.1)
Sodium: 139 mmol/L (ref 135–145)
TOTAL PROTEIN: 7.5 g/dL (ref 6.5–8.1)

## 2015-04-08 LAB — CBC WITH DIFFERENTIAL/PLATELET
BASOS PCT: 1 %
Basophils Absolute: 0.1 10*3/uL (ref 0.0–0.1)
EOS ABS: 0.3 10*3/uL (ref 0.0–0.7)
EOS PCT: 3 %
HCT: 43.2 % (ref 39.0–52.0)
Hemoglobin: 14.5 g/dL (ref 13.0–17.0)
Lymphocytes Relative: 27 %
Lymphs Abs: 2.3 10*3/uL (ref 0.7–4.0)
MCH: 31.3 pg (ref 26.0–34.0)
MCHC: 33.6 g/dL (ref 30.0–36.0)
MCV: 93.1 fL (ref 78.0–100.0)
MONO ABS: 0.8 10*3/uL (ref 0.1–1.0)
MONOS PCT: 9 %
NEUTROS ABS: 5.1 10*3/uL (ref 1.7–7.7)
Neutrophils Relative %: 60 %
PLATELETS: 304 10*3/uL (ref 150–400)
RBC: 4.64 MIL/uL (ref 4.22–5.81)
RDW: 13.9 % (ref 11.5–15.5)
WBC: 8.5 10*3/uL (ref 4.0–10.5)

## 2015-04-08 LAB — LIPASE, BLOOD: Lipase: 31 U/L (ref 22–51)

## 2015-04-08 LAB — URINALYSIS, ROUTINE W REFLEX MICROSCOPIC
BILIRUBIN URINE: NEGATIVE
Glucose, UA: NEGATIVE mg/dL
Hgb urine dipstick: NEGATIVE
KETONES UR: NEGATIVE mg/dL
Leukocytes, UA: NEGATIVE
Nitrite: NEGATIVE
Protein, ur: NEGATIVE mg/dL
Specific Gravity, Urine: 1.018 (ref 1.005–1.030)
UROBILINOGEN UA: 0.2 mg/dL (ref 0.0–1.0)
pH: 5.5 (ref 5.0–8.0)

## 2015-04-08 MED ORDER — ONDANSETRON 4 MG PO TBDP
8.0000 mg | ORAL_TABLET | Freq: Once | ORAL | Status: AC
  Administered 2015-04-08: 8 mg via ORAL

## 2015-04-08 MED ORDER — ONDANSETRON 4 MG PO TBDP: 4.0000 mg | ORAL_TABLET | Freq: Three times a day (TID) | ORAL | Status: DC | PRN

## 2015-04-08 MED ORDER — TRAMADOL HCL 50 MG PO TABS: 50.0000 mg | ORAL_TABLET | Freq: Two times a day (BID) | ORAL | Status: DC | PRN

## 2015-04-08 MED ORDER — ONDANSETRON 4 MG PO TBDP
ORAL_TABLET | ORAL | Status: AC
  Filled 2015-04-08: qty 2

## 2015-04-08 NOTE — ED Provider Notes (Signed)
CSN: [id number]     Arrival date & time 04/07/15  2347 History  This chart was scribed for George Balls, MD by Evelene Croon, ED Scribe. This patient was seen in room B16C/B16C and the patient's care was started 2:45 AM.    Chief Complaint  Patient presents with  . Abdominal Pain    The history is provided by the patient. No language interpreter was used.     HPI Comments:  Mohab Wise is a 44 y.o. male who presents to the Emergency Department complaining of intermittent, sharp, burning, stabbing, right lower abdominal pain and surrounding swelling that worsened last night. Pt has a history of a hernia repair to the site in 2001; states a hernia was felt by his PCP ~ 2 months ago at the same location. He reports heavy lifting at work but denies sudden ripping sensation in his abdomen.  He also reports associated nausea.  His last BM was yesterday (04/07/2015). Pt denies vomiting, fever, and chills. No alleviating factors noted.  Past Medical History  Diagnosis Date  . Hypertension   . Hyperlipidemia   . Obesity   . GERD (gastroesophageal reflux disease)   . Glucose intolerance (impaired glucose tolerance)   . Testicular cancer 2001  . OSA (obstructive sleep apnea) 02/17/2011   Past Surgical History  Procedure Laterality Date  . Testicle surgery  2001    resection for cancer left  . Lymph node removal  2001    from adb. -20  . Hernia repair    . Tendon release  05/2011    left foot  . Plantar fascia release  09/2013   Family History  Problem Relation Age of Onset  . Diabetes Paternal Grandfather   . Stroke Paternal Grandfather   . Ovarian cancer Paternal Grandmother   . Stomach cancer Paternal Grandmother    Social History  Substance Use Topics  . Smoking status: Never Smoker   . Smokeless tobacco: Never Used  . Alcohol Use: No    Review of Systems  A complete 10 system review of systems was obtained and all systems are negative except as noted in the HPI and PMH.     Allergies  Review of patient's allergies indicates no known allergies.  Home Medications   Prior to Admission medications   Medication Sig Start Date End Date Taking? Authorizing Provider  atorvastatin (LIPITOR) 40 MG tablet TAKE 1 TABLET BY MOUTH DAILY. 11/30/12   Jolaine Artist, MD  doxycycline (VIBRAMYCIN) 100 MG capsule Take 1 capsule (100 mg total) by mouth 2 (two) times daily. 09/26/14   Delos Haring, PA-C  HYDROcodone-acetaminophen (NORCO/VICODIN) 5-325 MG per tablet Take 1 tablet by mouth every 6 (six) hours as needed for pain. 11/29/12   Delos Haring, PA-C  HYDROcodone-acetaminophen (NORCO/VICODIN) 5-325 MG per tablet Take 1-2 tablets by mouth every 4 (four) hours as needed. 09/26/14   Tiffany Carlota Raspberry, PA-C  lisinopril (PRINIVIL,ZESTRIL) 40 MG tablet TAKE 1 TABLET BY MOUTH DAILY. 12/03/12   Shaune Pascal Bensimhon, MD  NEXIUM 40 MG capsule TAKE 1 CAPSULE BY MOUTH 2 TIMES DAILY BEFORE A MEAL 09/19/13   Jolaine Artist, MD  ondansetron (ZOFRAN) 4 MG tablet Take 1 tablet (4 mg total) by mouth every 6 (six) hours. 09/26/14   Tiffany Carlota Raspberry, PA-C  PARoxetine (PAXIL) 10 MG tablet TAKE 1 TABLET BY MOUTH EVERY MORNING. 12/10/12   Shaune Pascal Bensimhon, MD   BP 138/71 mmHg  Pulse 80  Temp(Src) 98.6 F (37 C) (Oral)  Resp 20  Wt 247 lb (112.038 kg)  SpO2 96% Physical Exam  Constitutional: He is oriented to person, place, and time. Vital signs are normal. He appears well-developed and well-nourished.  Non-toxic appearance. He does not appear ill. No distress.  HENT:  Head: Normocephalic and atraumatic.  Nose: Nose normal.  Mouth/Throat: Oropharynx is clear and moist. No oropharyngeal exudate.  Eyes: Conjunctivae and EOM are normal. Pupils are equal, round, and reactive to light. No scleral icterus.  Neck: Normal range of motion. Neck supple. No tracheal deviation, no edema, no erythema and normal range of motion present. No thyroid mass and no thyromegaly present.  Cardiovascular: Normal  rate, regular rhythm, S1 normal, S2 normal, normal heart sounds, intact distal pulses and normal pulses.  Exam reveals no gallop and no friction rub.   No murmur heard. Pulses:      Radial pulses are 2+ on the right side, and 2+ on the left side.       Dorsalis pedis pulses are 2+ on the right side, and 2+ on the left side.  Pulmonary/Chest: Effort normal and breath sounds normal. No respiratory distress. He has no wheezes. He has no rhonchi. He has no rales.  Abdominal: Soft. Normal appearance and bowel sounds are normal. He exhibits no distension and no ascites. There is no hepatosplenomegaly. There is no tenderness. There is no rebound, no guarding and no CVA tenderness.  Left midline hernia palpated; easily reducible  Musculoskeletal: Normal range of motion. He exhibits no edema or tenderness.  Lymphadenopathy:    He has no cervical adenopathy.  Neurological: He is alert and oriented to person, place, and time. He has normal strength. No cranial nerve deficit or sensory deficit.  Skin: Skin is warm, dry and intact. No petechiae and no rash noted. He is not diaphoretic. No erythema. No pallor.  Psychiatric: He has a normal mood and affect. His behavior is normal. Judgment normal.  Nursing note and vitals reviewed.   ED Course  Procedures   DIAGNOSTIC STUDIES:  Oxygen Saturation is 96% on RA, normal by my interpretation.    COORDINATION OF CARE:  2:50 AM Discussed treatment plan with pt at bedside and pt agreed to plan.  Labs Review Labs Reviewed  COMPREHENSIVE METABOLIC PANEL - Abnormal; Notable for the following:    Chloride 100 (*)    Glucose, Bld 107 (*)    All other components within normal limits  URINALYSIS, ROUTINE W REFLEX MICROSCOPIC (NOT AT [facility]) - Abnormal; Notable for the following:    APPearance CLOUDY (*)    All other components within normal limits  CBC WITH DIFFERENTIAL/PLATELET  LIPASE, BLOOD    Imaging Review No results found. I have personally reviewed  and evaluated these images and lab results as part of my medical decision-making.   EKG Interpretation None      MDM   Final diagnoses:  None   Patient presents emergency department for abdominal pain. He has a history of a hernia, physical exam reveals recurrence of this hernia. Education was provided, I will give surgical follow-up. CT scan is not needed as the hernia is easily reducible. Patient was advised to take ibuprofen for pain, tramadol for breakthrough pain. Also Zofran prescription was provided. He appears well in no acute distress, his vital signs remain within his normal limits and he is safe for discharge.   I personally performed the services described in this documentation, which was scribed in my presence. The recorded information has been reviewed and  is accurate.   George Balls, MD 04/08/15 [phone]

## 2015-04-08 NOTE — ED Notes (Signed)
Patient with abdominal pain that started 2 hours ago.  Patient states he does have some nausea, no vomiting, no diarrhea.  Patient states that it is a pressure that wants to push out.  Patient has had hernia repair in same area of pain.

## 2015-04-08 NOTE — ED Notes (Signed)
Discharge inst and prscriptions reviewed  Voiced understanding

## 2015-04-08 NOTE — Discharge Instructions (Signed)
Hernia Mr. Mark, take ibuprofen 800 mg every 8 hours as needed for your pain. If the pain becomes severe, take 1 tramadol. Use Zofran as needed for nausea. See general surgery within 3 days for close follow-up of your hernia. If any symptoms worsen come back to the emergency department immediately. Thank you. A hernia happens when an organ inside your body pushes out through a weak spot in your belly (abdominal) wall. Most hernias get worse over time. They can often be pushed back into place (reduced). Surgery may be needed to repair hernias that cannot be pushed into place. HOME CARE  Keep doing normal activities.  Avoid lifting more than 10 pounds (4.5 kilograms).  Cough gently and avoid straining. Over time, these things will:  Increase your hernia size.  Irritate your hernia.  Break down hernia repairs.  Stop smoking.  Do not wear anything tight over your hernia. Do not keep the hernia in with an outside bandage.  Eat food that is high in fiber (fruit, vegetables, whole grains).  Drink enough fluids to keep your pee (urine) clear or pale yellow.  Take medicines to make your poop soft (stool softeners) if you cannot poop (constipated). GET HELP RIGHT AWAY IF:   You have a fever.  You have belly pain that gets worse.  You feel sick to your stomach (nauseous) and throw up (vomit).  Your skin starts to bulge out.  Your hernia turns a different color, feels hard, or is tender.  You have increased pain or puffiness (swelling) around the hernia.  You poop more or less often.  Your poop does not look the way normally does.  You have watery poop (diarrhea).  You cannot push the hernia back in place by applying gentle pressure while lying down. MAKE SURE YOU:   Understand these instructions.  Will watch your condition.  Will get help right away if you are not doing well or get worse. Document Released: 12/18/2009 Document Revised: 09/22/2011 Document Reviewed:  12/18/2009 [facility] Patient Information 2015 [place], [place]. This information is not intended to replace advice given to you by your health care provider. Make sure you discuss any questions you have with your health care provider.

## 2015-04-11 ENCOUNTER — Other Ambulatory Visit: Payer: Self-pay | Admitting: General Surgery

## 2015-04-11 DIAGNOSIS — R1031 Right lower quadrant pain: Secondary | ICD-10-CM

## 2015-04-18 ENCOUNTER — Other Ambulatory Visit: Payer: 59

## 2015-04-18 ENCOUNTER — Ambulatory Visit
Admission: RE | Admit: 2015-04-18 | Discharge: 2015-04-18 | Disposition: A | Discharge status: Home / Self Care | Payer: 59 | Source: Ambulatory Visit | Attending: General Surgery | Admitting: General Surgery

## 2015-04-18 MED ORDER — IOPAMIDOL (ISOVUE-300) INJECTION 61%
125.0000 mL | Freq: Once | INTRAVENOUS | Status: AC | PRN
  Administered 2015-04-18: 125 mL via INTRAVENOUS

## 2015-07-25 MED FILL — PARoxetine HCL 10 MG TABS: 10 | 90 days supply | Qty: 90 | Fill #2

## 2015-07-25 MED FILL — ATORVASTATIN 40 MG TABLET: 40 | 90 days supply | Qty: 90 | Fill #2

## 2015-07-26 MED FILL — TRUE METRIX GLUCOSE TEST ST: 50 days supply | Qty: 100 | Fill #0

## 2015-08-16 MED FILL — metFORMIN HCL 500 MG TABS: 500 | 90 days supply | Qty: 90 | Fill #1

## 2015-08-17 DIAGNOSIS — Z789 Other specified health status: Secondary | ICD-10-CM | POA: Diagnosis not present

## 2015-08-17 DIAGNOSIS — E1165 Type 2 diabetes mellitus with hyperglycemia: Secondary | ICD-10-CM | POA: Diagnosis not present

## 2015-08-17 DIAGNOSIS — Z6832 Body mass index (BMI) 32.0-32.9, adult: Secondary | ICD-10-CM | POA: Diagnosis not present

## 2015-08-17 DIAGNOSIS — I10 Essential (primary) hypertension: Secondary | ICD-10-CM | POA: Diagnosis not present

## 2015-09-03 DIAGNOSIS — M7711 Lateral epicondylitis, right elbow: Secondary | ICD-10-CM | POA: Diagnosis not present

## 2015-10-01 MED FILL — ESOMEPRAZOLE MAG DR 40 MG C: 40 | 90 days supply | Qty: 180 | Fill #3

## 2015-10-02 MED FILL — TRUE METRIX GLUCOSE TEST ST: 50 days supply | Qty: 100 | Fill #1

## 2015-10-09 MED FILL — metFORMIN HCL 500 MG TABS: 500 | 90 days supply | Qty: 180 | Fill #0

## 2015-10-22 DIAGNOSIS — E1165 Type 2 diabetes mellitus with hyperglycemia: Secondary | ICD-10-CM | POA: Diagnosis not present

## 2015-10-22 DIAGNOSIS — E7800 Pure hypercholesterolemia, unspecified: Secondary | ICD-10-CM | POA: Diagnosis not present

## 2015-10-22 DIAGNOSIS — Z299 Encounter for prophylactic measures, unspecified: Secondary | ICD-10-CM | POA: Diagnosis not present

## 2015-10-22 DIAGNOSIS — R5383 Other fatigue: Secondary | ICD-10-CM | POA: Diagnosis not present

## 2015-10-22 DIAGNOSIS — Z0000 Encounter for general adult medical examination without abnormal findings: Secondary | ICD-10-CM | POA: Diagnosis not present

## 2015-11-05 MED FILL — ATORVASTATIN 40 MG TABLET: 40 | 90 days supply | Qty: 90 | Fill #3

## 2015-11-08 MED FILL — PARoxetine HCL 10 MG TABS: 10 | 90 days supply | Qty: 90 | Fill #0

## 2015-12-11 MED FILL — TRUE METRIX GLUCOSE TEST ST: 50 days supply | Qty: 100 | Fill #2

## 2015-12-19 DIAGNOSIS — G4733 Obstructive sleep apnea (adult) (pediatric): Secondary | ICD-10-CM | POA: Diagnosis not present

## 2016-01-01 DIAGNOSIS — R110 Nausea: Secondary | ICD-10-CM | POA: Diagnosis not present

## 2016-01-01 DIAGNOSIS — S61412A Laceration without foreign body of left hand, initial encounter: Secondary | ICD-10-CM | POA: Diagnosis not present

## 2016-01-01 DIAGNOSIS — Y92008 Other place in unspecified non-institutional (private) residence as the place of occurrence of the external cause: Secondary | ICD-10-CM | POA: Diagnosis not present

## 2016-01-01 DIAGNOSIS — E119 Type 2 diabetes mellitus without complications: Secondary | ICD-10-CM | POA: Diagnosis not present

## 2016-01-01 DIAGNOSIS — W228XXA Striking against or struck by other objects, initial encounter: Secondary | ICD-10-CM | POA: Diagnosis not present

## 2016-01-03 DIAGNOSIS — R609 Edema, unspecified: Secondary | ICD-10-CM | POA: Diagnosis not present

## 2016-01-03 DIAGNOSIS — S61412D Laceration without foreign body of left hand, subsequent encounter: Secondary | ICD-10-CM | POA: Diagnosis not present

## 2016-01-03 DIAGNOSIS — W228XXD Striking against or struck by other objects, subsequent encounter: Secondary | ICD-10-CM | POA: Diagnosis not present

## 2016-01-03 DIAGNOSIS — M79642 Pain in left hand: Secondary | ICD-10-CM | POA: Diagnosis not present

## 2016-01-03 DIAGNOSIS — Y92008 Other place in unspecified non-institutional (private) residence as the place of occurrence of the external cause: Secondary | ICD-10-CM | POA: Diagnosis not present

## 2016-01-07 MED FILL — metFORMIN HCL 500 MG TABS: 500 | 90 days supply | Qty: 180 | Fill #1

## 2016-01-08 MED FILL — ESOMEPRAZOLE MAG DR 40 MG C: 40 | 90 days supply | Qty: 180 | Fill #0

## 2016-01-11 DIAGNOSIS — S61412D Laceration without foreign body of left hand, subsequent encounter: Secondary | ICD-10-CM | POA: Diagnosis not present

## 2016-01-11 DIAGNOSIS — Z4802 Encounter for removal of sutures: Secondary | ICD-10-CM | POA: Diagnosis not present

## 2016-02-04 MED FILL — PARoxetine HCL 10 MG TABS: 10 | 90 days supply | Qty: 90 | Fill #1

## 2016-02-05 MED FILL — LISINOPRIL 20 MG TABLET: 20 | 90 days supply | Qty: 90 | Fill #0

## 2016-02-21 MED FILL — ATORVASTATIN 40 MG TABLET: 40 | 90 days supply | Qty: 90 | Fill #0

## 2016-02-22 DIAGNOSIS — M7711 Lateral epicondylitis, right elbow: Secondary | ICD-10-CM | POA: Diagnosis not present

## 2016-04-08 MED FILL — TRUE METRIX GLUCOSE TEST ST: 50 days supply | Qty: 100 | Fill #3

## 2016-04-14 MED FILL — ESOMEPRAZOLE MAG DR 40 MG C: 40 | 90 days supply | Qty: 180 | Fill #1

## 2016-04-14 MED FILL — metFORMIN HCL 850 MG TABS: 850 | 90 days supply | Qty: 180 | Fill #0

## 2016-04-15 DIAGNOSIS — M7711 Lateral epicondylitis, right elbow: Secondary | ICD-10-CM | POA: Diagnosis not present

## 2016-04-22 DIAGNOSIS — M7711 Lateral epicondylitis, right elbow: Secondary | ICD-10-CM | POA: Diagnosis not present

## 2016-04-24 ENCOUNTER — Emergency Department (HOSPITAL_COMMUNITY): Payer: 59

## 2016-04-24 ENCOUNTER — Encounter (HOSPITAL_COMMUNITY): Payer: Self-pay

## 2016-04-24 ENCOUNTER — Emergency Department (HOSPITAL_COMMUNITY)
Admission: EM | Admit: 2016-04-24 | Discharge: 2016-04-24 | Disposition: A | Discharge status: Home / Self Care | Payer: 59 | Attending: Emergency Medicine | Admitting: Emergency Medicine

## 2016-04-24 DIAGNOSIS — Z79899 Other long term (current) drug therapy: Secondary | ICD-10-CM | POA: Diagnosis not present

## 2016-04-24 DIAGNOSIS — R109 Unspecified abdominal pain: Secondary | ICD-10-CM | POA: Insufficient documentation

## 2016-04-24 DIAGNOSIS — N200 Calculus of kidney: Secondary | ICD-10-CM | POA: Diagnosis not present

## 2016-04-24 DIAGNOSIS — M545 Low back pain, unspecified: Secondary | ICD-10-CM

## 2016-04-24 DIAGNOSIS — I10 Essential (primary) hypertension: Secondary | ICD-10-CM | POA: Diagnosis not present

## 2016-04-24 LAB — CBC WITH DIFFERENTIAL/PLATELET
Basophils Absolute: 0.1 10*3/uL (ref 0.0–0.1)
Basophils Relative: 1 %
EOS ABS: 0.2 10*3/uL (ref 0.0–0.7)
Eosinophils Relative: 3 %
HCT: 43.1 % (ref 39.0–52.0)
HEMOGLOBIN: 14.9 g/dL (ref 13.0–17.0)
Lymphocytes Relative: 24 %
Lymphs Abs: 1.7 10*3/uL (ref 0.7–4.0)
MCH: 31.4 pg (ref 26.0–34.0)
MCHC: 34.6 g/dL (ref 30.0–36.0)
MCV: 90.9 fL (ref 78.0–100.0)
MONO ABS: 0.7 10*3/uL (ref 0.1–1.0)
MONOS PCT: 10 %
Neutro Abs: 4.3 10*3/uL (ref 1.7–7.7)
Neutrophils Relative %: 62 %
Platelets: 220 10*3/uL (ref 150–400)
RBC: 4.74 MIL/uL (ref 4.22–5.81)
RDW: 12.3 % (ref 11.5–15.5)
WBC: 6.9 10*3/uL (ref 4.0–10.5)

## 2016-04-24 LAB — URINALYSIS, ROUTINE W REFLEX MICROSCOPIC
Bilirubin Urine: NEGATIVE
Glucose, UA: NEGATIVE mg/dL
Hgb urine dipstick: NEGATIVE
Ketones, ur: NEGATIVE mg/dL
Leukocytes, UA: NEGATIVE
Nitrite: NEGATIVE
Protein, ur: NEGATIVE mg/dL
SPECIFIC GRAVITY, URINE: 1.029 (ref 1.005–1.030)
pH: 6 (ref 5.0–8.0)

## 2016-04-24 LAB — I-STAT CHEM 8, ED
BUN: 21 mg/dL — ABNORMAL HIGH (ref 6–20)
CHLORIDE: 104 mmol/L (ref 101–111)
Calcium, Ion: 1.16 mmol/L (ref 1.15–1.40)
Creatinine, Ser: 1 mg/dL (ref 0.61–1.24)
Glucose, Bld: 122 mg/dL — ABNORMAL HIGH (ref 65–99)
HCT: 43 % (ref 39.0–52.0)
Hemoglobin: 14.6 g/dL (ref 13.0–17.0)
Potassium: 4.1 mmol/L (ref 3.5–5.1)
Sodium: 140 mmol/L (ref 135–145)
TCO2: 25 mmol/L (ref 0–100)

## 2016-04-24 MED ORDER — METAXALONE 800 MG PO TABS: 800.0000 mg | ORAL_TABLET | Freq: Three times a day (TID) | ORAL | 0 refills | Status: DC | PRN

## 2016-04-24 MED ORDER — KETOROLAC TROMETHAMINE 30 MG/ML IJ SOLN
30.0000 mg | Freq: Once | INTRAMUSCULAR | Status: AC
  Administered 2016-04-24: 30 mg via INTRAVENOUS
  Filled 2016-04-24: qty 1

## 2016-04-24 MED ORDER — HYDROMORPHONE HCL 1 MG/ML IJ SOLN
1.0000 mg | Freq: Once | INTRAMUSCULAR | Status: AC
  Administered 2016-04-24: 1 mg via INTRAVENOUS
  Filled 2016-04-24: qty 1

## 2016-04-24 MED ORDER — PROMETHAZINE HCL 25 MG/ML IJ SOLN
12.5000 mg | Freq: Once | INTRAMUSCULAR | Status: AC
  Administered 2016-04-24: 12.5 mg via INTRAVENOUS
  Filled 2016-04-24: qty 1

## 2016-04-24 MED ORDER — HYDROCODONE-ACETAMINOPHEN 5-325 MG PO TABS: 1.0000 | ORAL_TABLET | ORAL | 0 refills | Status: DC | PRN

## 2016-04-24 MED ORDER — METAXALONE 800 MG PO TABS
800.0000 mg | ORAL_TABLET | Freq: Once | ORAL | Status: AC
  Administered 2016-04-24: 800 mg via ORAL
  Filled 2016-04-24: qty 1

## 2016-04-24 MED ORDER — HYDROMORPHONE HCL 1 MG/ML IJ SOLN
0.5000 mg | Freq: Once | INTRAMUSCULAR | Status: AC
  Administered 2016-04-24: 0.5 mg via INTRAVENOUS
  Filled 2016-04-24: qty 1

## 2016-04-24 MED FILL — HYDROCODON-APAP 5-325: 5-325 | 1 days supply | Qty: 10 | Fill #0

## 2016-04-24 MED FILL — METAXALONE 800 MG TABLET: 800 | 3 days supply | Qty: 10 | Fill #0

## 2016-04-24 NOTE — ED Provider Notes (Signed)
[facility] DEPT Provider Note   CSN: [id number] Arrival date & time: 04/24/16  0808     History   Chief Complaint Chief Complaint  Patient presents with  . Back Pain    HPI George Wise is a 45 y.o. male.  HPI Patient presents with left sided lower back pain. Began yesterday. States Better with a bath but then returned. States it worsened after lifting something. States he cannot find a position of comfort. No dysuria. Has not had pain like this before. States it stays in the back does not go down the leg. He's had nausea with it. No vomiting. No dysuria. No fevers or chills. No weakness. No relief with the Vicodin at home. Past Medical History:  Diagnosis Date  . GERD (gastroesophageal reflux disease)   . Glucose intolerance (impaired glucose tolerance)   . Hyperlipidemia   . Hypertension   . Obesity   . OSA (obstructive sleep apnea) 02/17/2011  . Testicular cancer [facility]) 2001    Patient Active Problem List   Diagnosis Date Noted  . Obesity 02/02/2012  . OSA (obstructive sleep apnea) 02/17/2011  . Encounter for examination for driving license [id number]  . HYPERCHOLESTEROLEMIA 03/23/2009  . HYPERTENSION, BENIGN 03/23/2009    Past Surgical History:  Procedure Laterality Date  . HERNIA REPAIR    . lymph node removal  2001   from adb. -20  . PLANTAR FASCIA RELEASE  09/2013  . TENDON RELEASE  05/2011   left foot  . TESTICLE SURGERY  2001   resection for cancer left       Home Medications    Prior to Admission medications   Medication Sig Start Date End Date Taking? Authorizing Provider  atorvastatin (LIPITOR) 40 MG tablet TAKE 1 TABLET BY MOUTH DAILY. 11/30/12  Yes Shaune Pascal Bensimhon, MD  lisinopril (PRINIVIL,ZESTRIL) 40 MG tablet TAKE 1 TABLET BY MOUTH DAILY. 12/03/12  Yes Shaune Pascal Bensimhon, MD  NEXIUM 40 MG capsule TAKE 1 CAPSULE BY MOUTH 2 TIMES DAILY BEFORE A MEAL 09/19/13  Yes Jolaine Artist, MD  PARoxetine (PAXIL) 10 MG tablet TAKE 1 TABLET BY  MOUTH EVERY MORNING. 12/10/12  Yes Jolaine Artist, MD  doxycycline (VIBRAMYCIN) 100 MG capsule Take 1 capsule (100 mg total) by mouth 2 (two) times daily. Patient not taking: Reported on 04/24/2016 09/26/14   Delos Haring, PA-C  HYDROcodone-acetaminophen (NORCO/VICODIN) 5-325 MG tablet Take 1-2 tablets by mouth every 4 (four) hours as needed. 04/24/16   Davonna Belling, MD  metaxalone (SKELAXIN) 800 MG tablet Take 1 tablet (800 mg total) by mouth 3 (three) times daily as needed for muscle spasms. 04/24/16   Davonna Belling, MD  ondansetron (ZOFRAN ODT) 4 MG disintegrating tablet Take 1 tablet (4 mg total) by mouth every 8 (eight) hours as needed for nausea or vomiting. Patient not taking: Reported on 04/24/2016 04/08/15   Everlene Balls, MD  ondansetron (ZOFRAN) 4 MG tablet Take 1 tablet (4 mg total) by mouth every 6 (six) hours. Patient not taking: Reported on 04/24/2016 09/26/14   Delos Haring, PA-C  traMADol (ULTRAM) 50 MG tablet Take 1 tablet (50 mg total) by mouth every 12 (twelve) hours as needed for severe pain. 04/08/15   Everlene Balls, MD    Family History Family History  Problem Relation Age of Onset  . Diabetes Paternal Grandfather   . Stroke Paternal Grandfather   . Ovarian cancer Paternal Grandmother   . Stomach cancer Paternal Grandmother     Social History Social History  Substance Use Topics  . Smoking status: Never Smoker  . Smokeless tobacco: Never Used  . Alcohol use No     Allergies   Review of patient's allergies indicates no known allergies.   Review of Systems Review of Systems  Constitutional: Negative for activity change and appetite change.  Eyes: Negative for pain.  Respiratory: Negative for chest tightness and shortness of breath.   Cardiovascular: Negative for chest pain and leg swelling.  Gastrointestinal: Positive for nausea. Negative for abdominal pain, diarrhea and vomiting.  Genitourinary: Negative for flank pain.  Musculoskeletal: Positive  for back pain. Negative for neck stiffness.  Skin: Negative for rash.  Neurological: Negative for weakness, numbness and headaches.  Psychiatric/Behavioral: Negative for behavioral problems.     Physical Exam Updated Vital Signs BP 128/81   Pulse 64   Temp 98.5 F (36.9 C) (Oral)   Resp 15   Ht 6\' 2"  (1.88 m)   Wt 275 lb (124.7 kg)   SpO2 97%   BMI 35.31 kg/m   Physical Exam  Constitutional: He appears well-developed.  Patient appears uncomfortable  HENT:  Head: Atraumatic.  Neck: Neck supple.  Cardiovascular: Normal rate.   Pulmonary/Chest: Effort normal.  Abdominal: Soft. There is no tenderness.  Genitourinary:  Genitourinary Comments: No testicular tenderness  Musculoskeletal: He exhibits no edema.  No pain with straight leg raise. No low back tenderness. Pain may be worse with certain movements. No rash.  Neurological: He is alert.  Skin: Skin is warm. Capillary refill takes less than 2 seconds.     ED Treatments / Results  Labs (all labs ordered are listed, but only abnormal results are displayed) Labs Reviewed  I-STAT CHEM 8, ED - Abnormal; Notable for the following:       Result Value   BUN 21 (*)    Glucose, Bld 122 (*)    All other components within normal limits  CBC WITH DIFFERENTIAL/PLATELET  URINALYSIS, ROUTINE W REFLEX MICROSCOPIC (NOT AT [facility])    EKG  EKG Interpretation None       Radiology Ct Renal Stone Study  Result Date: 04/24/2016 CLINICAL DATA:  Left flank pain for 1 day. History of testicular cancer. EXAM: CT ABDOMEN AND PELVIS WITHOUT CONTRAST TECHNIQUE: Multidetector CT imaging of the abdomen and pelvis was performed following the standard protocol without IV contrast. COMPARISON:  04/18/2015 CT abdomen/pelvis. FINDINGS: Lower chest: No significant pulmonary nodules or acute consolidative airspace disease. Hepatobiliary: Normal liver with no liver mass. Normal gallbladder with no radiopaque cholelithiasis. No biliary ductal  dilatation. Pancreas: Normal, with no mass or duct dilation. Spleen: Normal size. No mass. Adrenals/Urinary Tract: Normal adrenals. Nonobstructing 4 mm lower right renal stone. Nonobstructing punctate 1 mm lower left renal stone. No hydronephrosis. Normal caliber ureters, with no ureteral stones. No contour deforming renal mass. Collapsed and grossly normal bladder with no bladder stones. Stomach/Bowel: Grossly normal stomach. Normal caliber small bowel with no small bowel wall thickening. Appendix not discretely visualized, with no pericecal inflammatory changes. Normal large bowel with no diverticulosis, large bowel wall thickening or pericolonic fat stranding. Vascular/Lymphatic: Normal caliber abdominal aorta. Innumerable surgical clips are noted throughout the bilateral retroperitoneum. No pathologically enlarged lymph nodes in the abdomen or pelvis. Reproductive: Normal size prostate. Other: No pneumoperitoneum, ascites or focal fluid collection. Partially visualized left orchiectomy. Musculoskeletal: No aggressive appearing focal osseous lesions. Mild thoracolumbar spondylosis. IMPRESSION: 1. Nonobstructing bilateral nephrolithiasis. No hydronephrosis. No ureteral or bladder stones. 2. Extensive postsurgical changes in the bilateral retroperitoneum, unchanged. No  noncontrast CT evidence of metastatic disease in the abdomen or pelvis. 3. No evidence of bowel obstruction or acute bowel inflammation. Electronically Signed   By: Ilona Sorrel M.D.   On: 04/24/2016 09:52    Procedures Procedures (including critical care time)  Medications Ordered in ED Medications  HYDROmorphone (DILAUDID) injection 0.5 mg (0.5 mg Intravenous Given 04/24/16 0840)  promethazine (PHENERGAN) injection 12.5 mg (12.5 mg Intravenous Given 04/24/16 0911)  ketorolac (TORADOL) 30 MG/ML injection 30 mg (30 mg Intravenous Given 04/24/16 1029)  metaxalone (SKELAXIN) tablet 800 mg (800 mg Oral Given 04/24/16 1029)  HYDROmorphone  (DILAUDID) injection 1 mg (1 mg Intravenous Given 04/24/16 1104)     Initial Impression / Assessment and Plan / ED Course  I have reviewed the triage vital signs and the nursing notes.  Pertinent labs & imaging results that were available during my care of the patient were reviewed by me and considered in my medical decision making (see chart for details).  Clinical Course    Patient with left-sided low back pain. Labs reassuring. Renal stones but no ureteral stones. Potentially musculoskeletal. Zoster also considered. Will discharge pain medicine and muscle relaxers will follow-up as needed. Previous left-sided testicle removal for cancer.  Final Clinical Impressions(s) / ED Diagnoses   Final diagnoses:  Acute left-sided low back pain without sciatica    New Prescriptions New Prescriptions   HYDROCODONE-ACETAMINOPHEN (NORCO/VICODIN) 5-325 MG TABLET    Take 1-2 tablets by mouth every 4 (four) hours as needed.   METAXALONE (SKELAXIN) 800 MG TABLET    Take 1 tablet (800 mg total) by mouth 3 (three) times daily as needed for muscle spasms.     Davonna Belling, MD 04/24/16 [phone]

## 2016-04-24 NOTE — ED Triage Notes (Signed)
Pt c/o back pain that started yesterday. Pt states that the pain went away yesterday afternoon then started up again last night. Pt does endorse nausea. Pt denies fevers and chills.

## 2016-05-05 MED FILL — LISINOPRIL 20 MG TABLET: 20 | 90 days supply | Qty: 90 | Fill #1

## 2016-05-05 MED FILL — PARoxetine HCL 10 MG TABS: 10 | 90 days supply | Qty: 90 | Fill #2

## 2016-05-09 DIAGNOSIS — Z299 Encounter for prophylactic measures, unspecified: Secondary | ICD-10-CM | POA: Diagnosis not present

## 2016-05-09 DIAGNOSIS — G4730 Sleep apnea, unspecified: Secondary | ICD-10-CM | POA: Diagnosis not present

## 2016-05-09 DIAGNOSIS — Z6834 Body mass index (BMI) 34.0-34.9, adult: Secondary | ICD-10-CM | POA: Diagnosis not present

## 2016-05-09 DIAGNOSIS — E1165 Type 2 diabetes mellitus with hyperglycemia: Secondary | ICD-10-CM | POA: Diagnosis not present

## 2016-05-09 DIAGNOSIS — E7800 Pure hypercholesterolemia, unspecified: Secondary | ICD-10-CM | POA: Diagnosis not present

## 2016-05-13 MED FILL — ATORVASTATIN 40 MG TABLET: 40 | 90 days supply | Qty: 90 | Fill #1

## 2016-05-30 DIAGNOSIS — Z135 Encounter for screening for eye and ear disorders: Secondary | ICD-10-CM | POA: Diagnosis not present

## 2016-05-30 DIAGNOSIS — E119 Type 2 diabetes mellitus without complications: Secondary | ICD-10-CM | POA: Diagnosis not present

## 2016-05-30 DIAGNOSIS — H52203 Unspecified astigmatism, bilateral: Secondary | ICD-10-CM | POA: Diagnosis not present

## 2016-06-30 DIAGNOSIS — G4733 Obstructive sleep apnea (adult) (pediatric): Secondary | ICD-10-CM | POA: Diagnosis not present

## 2016-07-11 MED FILL — metFORMIN HCL 850 MG TABS: 850 | 90 days supply | Qty: 180 | Fill #1

## 2016-07-11 MED FILL — ESOMEPRAZOLE MAG DR 40 MG C: 40 | 90 days supply | Qty: 180 | Fill #2

## 2016-07-11 MED FILL — TRUE METRIX GLUCOSE TEST ST: 50 days supply | Qty: 100 | Fill #0

## 2016-08-15 DIAGNOSIS — Z299 Encounter for prophylactic measures, unspecified: Secondary | ICD-10-CM | POA: Diagnosis not present

## 2016-08-15 DIAGNOSIS — E7800 Pure hypercholesterolemia, unspecified: Secondary | ICD-10-CM | POA: Diagnosis not present

## 2016-08-15 DIAGNOSIS — I10 Essential (primary) hypertension: Secondary | ICD-10-CM | POA: Diagnosis not present

## 2016-08-15 DIAGNOSIS — Z713 Dietary counseling and surveillance: Secondary | ICD-10-CM | POA: Diagnosis not present

## 2016-08-15 DIAGNOSIS — A770 Spotted fever due to Rickettsia rickettsii: Secondary | ICD-10-CM | POA: Diagnosis not present

## 2016-08-15 DIAGNOSIS — E1165 Type 2 diabetes mellitus with hyperglycemia: Secondary | ICD-10-CM | POA: Diagnosis not present

## 2016-08-15 DIAGNOSIS — Z6834 Body mass index (BMI) 34.0-34.9, adult: Secondary | ICD-10-CM | POA: Diagnosis not present

## 2016-08-18 MED FILL — PARoxetine HCL 10 MG TABS: 10 | 90 days supply | Qty: 90 | Fill #3

## 2016-08-18 MED FILL — ATORVASTATIN 40 MG TABLET: 40 | 90 days supply | Qty: 90 | Fill #2

## 2016-08-18 MED FILL — LISINOPRIL 20 MG TABLET: 20 | 90 days supply | Qty: 90 | Fill #2

## 2016-10-17 MED FILL — ESOMEPRAZOLE MAG DR 40 MG C: 40 | 90 days supply | Qty: 180 | Fill #3

## 2016-10-20 MED FILL — metFORMIN HCL 850 MG TABS: 850 | 90 days supply | Qty: 180 | Fill #0

## 2016-10-24 DIAGNOSIS — F419 Anxiety disorder, unspecified: Secondary | ICD-10-CM | POA: Diagnosis not present

## 2016-10-24 DIAGNOSIS — E7800 Pure hypercholesterolemia, unspecified: Secondary | ICD-10-CM | POA: Diagnosis not present

## 2016-10-24 DIAGNOSIS — Z1389 Encounter for screening for other disorder: Secondary | ICD-10-CM | POA: Diagnosis not present

## 2016-10-24 DIAGNOSIS — Z0000 Encounter for general adult medical examination without abnormal findings: Secondary | ICD-10-CM | POA: Diagnosis not present

## 2016-10-24 DIAGNOSIS — I10 Essential (primary) hypertension: Secondary | ICD-10-CM | POA: Diagnosis not present

## 2016-10-24 DIAGNOSIS — G4730 Sleep apnea, unspecified: Secondary | ICD-10-CM | POA: Diagnosis not present

## 2016-10-24 DIAGNOSIS — Z789 Other specified health status: Secondary | ICD-10-CM | POA: Diagnosis not present

## 2016-10-24 DIAGNOSIS — Z299 Encounter for prophylactic measures, unspecified: Secondary | ICD-10-CM | POA: Diagnosis not present

## 2016-10-24 DIAGNOSIS — Z79899 Other long term (current) drug therapy: Secondary | ICD-10-CM | POA: Diagnosis not present

## 2016-10-24 DIAGNOSIS — E1165 Type 2 diabetes mellitus with hyperglycemia: Secondary | ICD-10-CM | POA: Diagnosis not present

## 2016-10-24 DIAGNOSIS — Z6834 Body mass index (BMI) 34.0-34.9, adult: Secondary | ICD-10-CM | POA: Diagnosis not present

## 2016-11-24 MED FILL — ATORVASTATIN 40 MG TABLET: 40 | 90 days supply | Qty: 90 | Fill #3

## 2016-11-24 MED FILL — LISINOPRIL 20 MG TABLET: 20 | 90 days supply | Qty: 90 | Fill #3

## 2016-11-25 DIAGNOSIS — G4730 Sleep apnea, unspecified: Secondary | ICD-10-CM | POA: Diagnosis not present

## 2016-11-25 DIAGNOSIS — Z299 Encounter for prophylactic measures, unspecified: Secondary | ICD-10-CM | POA: Diagnosis not present

## 2016-11-25 DIAGNOSIS — Z6834 Body mass index (BMI) 34.0-34.9, adult: Secondary | ICD-10-CM | POA: Diagnosis not present

## 2016-11-25 DIAGNOSIS — K219 Gastro-esophageal reflux disease without esophagitis: Secondary | ICD-10-CM | POA: Diagnosis not present

## 2016-11-25 DIAGNOSIS — F419 Anxiety disorder, unspecified: Secondary | ICD-10-CM | POA: Diagnosis not present

## 2016-11-25 DIAGNOSIS — E1165 Type 2 diabetes mellitus with hyperglycemia: Secondary | ICD-10-CM | POA: Diagnosis not present

## 2016-11-25 DIAGNOSIS — Z713 Dietary counseling and surveillance: Secondary | ICD-10-CM | POA: Diagnosis not present

## 2016-11-25 DIAGNOSIS — E7800 Pure hypercholesterolemia, unspecified: Secondary | ICD-10-CM | POA: Diagnosis not present

## 2016-11-25 DIAGNOSIS — I10 Essential (primary) hypertension: Secondary | ICD-10-CM | POA: Diagnosis not present

## 2016-11-25 DIAGNOSIS — E8881 Metabolic syndrome: Secondary | ICD-10-CM | POA: Diagnosis not present

## 2017-01-26 MED FILL — metFORMIN HCL 850 MG TABS: 850 | 90 days supply | Qty: 180 | Fill #1

## 2017-01-27 MED FILL — ESOMEPRAZOLE MAG DR 40 MG C: 40 | 90 days supply | Qty: 180 | Fill #0

## 2017-01-28 DIAGNOSIS — M79671 Pain in right foot: Secondary | ICD-10-CM | POA: Diagnosis not present

## 2017-01-28 DIAGNOSIS — M19071 Primary osteoarthritis, right ankle and foot: Secondary | ICD-10-CM | POA: Diagnosis not present

## 2017-01-28 DIAGNOSIS — M779 Enthesopathy, unspecified: Secondary | ICD-10-CM | POA: Diagnosis not present

## 2017-02-14 DIAGNOSIS — M549 Dorsalgia, unspecified: Secondary | ICD-10-CM | POA: Diagnosis not present

## 2017-02-14 DIAGNOSIS — E119 Type 2 diabetes mellitus without complications: Secondary | ICD-10-CM | POA: Diagnosis not present

## 2017-02-14 DIAGNOSIS — I10 Essential (primary) hypertension: Secondary | ICD-10-CM | POA: Diagnosis not present

## 2017-02-24 ENCOUNTER — Other Ambulatory Visit (HOSPITAL_COMMUNITY): Payer: Self-pay | Admitting: Nurse Practitioner

## 2017-02-24 ENCOUNTER — Ambulatory Visit (HOSPITAL_COMMUNITY)
Admission: RE | Admit: 2017-02-24 | Discharge: 2017-02-24 | Disposition: A | Discharge status: Home / Self Care | Payer: 59 | Source: Ambulatory Visit | Attending: Nurse Practitioner | Admitting: Nurse Practitioner

## 2017-02-24 DIAGNOSIS — E1165 Type 2 diabetes mellitus with hyperglycemia: Secondary | ICD-10-CM | POA: Diagnosis not present

## 2017-02-24 DIAGNOSIS — M545 Low back pain: Secondary | ICD-10-CM | POA: Diagnosis not present

## 2017-02-24 DIAGNOSIS — Z299 Encounter for prophylactic measures, unspecified: Secondary | ICD-10-CM | POA: Diagnosis not present

## 2017-02-24 DIAGNOSIS — M5126 Other intervertebral disc displacement, lumbar region: Secondary | ICD-10-CM | POA: Diagnosis not present

## 2017-02-24 DIAGNOSIS — I10 Essential (primary) hypertension: Secondary | ICD-10-CM | POA: Diagnosis not present

## 2017-02-24 DIAGNOSIS — M5124 Other intervertebral disc displacement, thoracic region: Secondary | ICD-10-CM | POA: Diagnosis not present

## 2017-02-24 DIAGNOSIS — E7800 Pure hypercholesterolemia, unspecified: Secondary | ICD-10-CM | POA: Diagnosis not present

## 2017-02-24 DIAGNOSIS — Z6834 Body mass index (BMI) 34.0-34.9, adult: Secondary | ICD-10-CM | POA: Diagnosis not present

## 2017-02-24 DIAGNOSIS — M79605 Pain in left leg: Secondary | ICD-10-CM | POA: Diagnosis not present

## 2017-03-02 DIAGNOSIS — M5126 Other intervertebral disc displacement, lumbar region: Secondary | ICD-10-CM | POA: Diagnosis not present

## 2017-03-02 DIAGNOSIS — E7800 Pure hypercholesterolemia, unspecified: Secondary | ICD-10-CM | POA: Diagnosis not present

## 2017-03-02 DIAGNOSIS — Z6838 Body mass index (BMI) 38.0-38.9, adult: Secondary | ICD-10-CM | POA: Diagnosis not present

## 2017-03-02 DIAGNOSIS — I10 Essential (primary) hypertension: Secondary | ICD-10-CM | POA: Diagnosis not present

## 2017-03-02 DIAGNOSIS — M5440 Lumbago with sciatica, unspecified side: Secondary | ICD-10-CM | POA: Diagnosis not present

## 2017-03-02 DIAGNOSIS — Z299 Encounter for prophylactic measures, unspecified: Secondary | ICD-10-CM | POA: Diagnosis not present

## 2017-03-02 DIAGNOSIS — M5414 Radiculopathy, thoracic region: Secondary | ICD-10-CM | POA: Diagnosis not present

## 2017-03-02 DIAGNOSIS — E1165 Type 2 diabetes mellitus with hyperglycemia: Secondary | ICD-10-CM | POA: Diagnosis not present

## 2017-03-03 MED FILL — LISINOPRIL 20 MG TAB: 20 | 90 days supply | Qty: 90 | Fill #0

## 2017-03-03 MED FILL — PARoxetine HCL 10 MG TABS: 10 | 90 days supply | Qty: 90 | Fill #0

## 2017-03-03 MED FILL — ATORVASTATIN 40 MG TABLET: 40 | 90 days supply | Qty: 90 | Fill #0

## 2017-03-04 ENCOUNTER — Other Ambulatory Visit (HOSPITAL_COMMUNITY): Payer: Self-pay | Admitting: Neurosurgery

## 2017-03-04 DIAGNOSIS — M5414 Radiculopathy, thoracic region: Secondary | ICD-10-CM

## 2017-03-12 ENCOUNTER — Ambulatory Visit (HOSPITAL_COMMUNITY)
Admission: RE | Admit: 2017-03-12 | Discharge: 2017-03-12 | Disposition: A | Discharge status: Home / Self Care | Payer: 59 | Source: Ambulatory Visit | Attending: Neurosurgery | Admitting: Neurosurgery

## 2017-03-12 DIAGNOSIS — M4804 Spinal stenosis, thoracic region: Secondary | ICD-10-CM | POA: Diagnosis not present

## 2017-03-12 DIAGNOSIS — M5114 Intervertebral disc disorders with radiculopathy, thoracic region: Secondary | ICD-10-CM | POA: Diagnosis not present

## 2017-03-12 DIAGNOSIS — M5414 Radiculopathy, thoracic region: Secondary | ICD-10-CM

## 2017-03-12 DIAGNOSIS — M5134 Other intervertebral disc degeneration, thoracic region: Secondary | ICD-10-CM | POA: Diagnosis not present

## 2017-03-23 DIAGNOSIS — I10 Essential (primary) hypertension: Secondary | ICD-10-CM | POA: Diagnosis not present

## 2017-03-23 DIAGNOSIS — M5126 Other intervertebral disc displacement, lumbar region: Secondary | ICD-10-CM | POA: Diagnosis not present

## 2017-03-23 DIAGNOSIS — M5416 Radiculopathy, lumbar region: Secondary | ICD-10-CM | POA: Diagnosis not present

## 2017-03-23 DIAGNOSIS — Z6834 Body mass index (BMI) 34.0-34.9, adult: Secondary | ICD-10-CM | POA: Diagnosis not present

## 2017-03-23 DIAGNOSIS — M48062 Spinal stenosis, lumbar region with neurogenic claudication: Secondary | ICD-10-CM | POA: Diagnosis not present

## 2017-03-24 DIAGNOSIS — I10 Essential (primary) hypertension: Secondary | ICD-10-CM | POA: Diagnosis not present

## 2017-03-24 DIAGNOSIS — Z6834 Body mass index (BMI) 34.0-34.9, adult: Secondary | ICD-10-CM | POA: Diagnosis not present

## 2017-03-24 DIAGNOSIS — M48062 Spinal stenosis, lumbar region with neurogenic claudication: Secondary | ICD-10-CM | POA: Diagnosis not present

## 2017-03-24 DIAGNOSIS — M5126 Other intervertebral disc displacement, lumbar region: Secondary | ICD-10-CM | POA: Diagnosis not present

## 2017-03-24 DIAGNOSIS — M5416 Radiculopathy, lumbar region: Secondary | ICD-10-CM | POA: Diagnosis not present

## 2017-04-10 DIAGNOSIS — M5416 Radiculopathy, lumbar region: Secondary | ICD-10-CM | POA: Diagnosis not present

## 2017-04-10 DIAGNOSIS — M5126 Other intervertebral disc displacement, lumbar region: Secondary | ICD-10-CM | POA: Diagnosis not present

## 2017-04-10 DIAGNOSIS — M48062 Spinal stenosis, lumbar region with neurogenic claudication: Secondary | ICD-10-CM | POA: Diagnosis not present

## 2017-04-14 DIAGNOSIS — G4733 Obstructive sleep apnea (adult) (pediatric): Secondary | ICD-10-CM | POA: Diagnosis not present

## 2017-04-27 ENCOUNTER — Other Ambulatory Visit: Payer: Self-pay | Admitting: Neurosurgery

## 2017-04-27 DIAGNOSIS — M5124 Other intervertebral disc displacement, thoracic region: Secondary | ICD-10-CM | POA: Diagnosis not present

## 2017-04-27 DIAGNOSIS — M5414 Radiculopathy, thoracic region: Secondary | ICD-10-CM | POA: Diagnosis not present

## 2017-04-30 NOTE — Pre-Procedure Instructions (Signed)
[name] [facility]  04/30/2017      [place], [place] - [address] - [facility]. [address] [place] [address] Phone: [phone] Fax: [phone]  [place], [place] - [address] [place] [address] [place] [address] Phone: [phone] Fax: [phone]    Your procedure is scheduled on Oct 24  Report to [facility] at 1200   Call this number if you have problems the morning of surgery:  [phone]   Remember:  Do not eat food or drink liquids after midnight.  Take these medicines the morning of surgery with A SIP OF WATER Hydrocodone (Norco) if needed, Metaxalone (Skelaxin) if needed, Nexium  Stop taking aspirin, BC's, Goody's, Herbal medications, Fish Oil, Ibuprofen, Advil, Motrin, Aleve    How to Manage Your Diabetes Before and After Surgery  Why is it important to control my blood sugar before and after surgery? . Improving blood sugar levels before and after surgery helps healing and can limit problems. . A way of improving blood sugar control is eating a healthy diet by: o  Eating less sugar and carbohydrates o  Increasing activity/exercise o  Talking with your doctor about reaching your blood sugar goals . High blood sugars (greater than 180 mg/dL) can raise your risk of infections and slow your recovery, so you will need to focus on controlling your diabetes during the weeks before surgery. . Make sure that the doctor who takes care of your diabetes knows about your planned surgery including the date and location.  How do I manage my blood sugar before surgery? . Check your blood sugar at least 4 times a day, starting 2 days before surgery, to make sure that the level is not too high or low. o Check your blood sugar the morning of your surgery when you wake up and every 2 hours until you get to the Short Stay unit. . If your blood sugar is less than 70 mg/dL,  you will need to treat for low blood sugar: o Do not take insulin. o Treat a low blood sugar (less than 70 mg/dL) with  cup of clear juice (cranberry or apple), 4 glucose tablets, OR glucose gel. o Recheck blood sugar in 15 minutes after treatment (to make sure it is greater than 70 mg/dL). If your blood sugar is not greater than 70 mg/dL on recheck, call [phone] for further instructions. . Report your blood sugar to the short stay nurse when you get to Short Stay.  . If you are admitted to the hospital after surgery: o Your blood sugar will be checked by the staff and you will probably be given insulin after surgery (instead of oral diabetes medicines) to make sure you have good blood sugar levels. o The goal for blood sugar control after surgery is 80-180 mg/dL.              WHAT DO I DO ABOUT MY DIABETES MEDICATION?   Marland Kitchen Do not take oral diabetes medicines (pills) the morning of surgery. Metformin (Glucophage)     . The day of surgery, do not take other diabetes injectables, including Byetta (exenatide), Bydureon (exenatide ER), Victoza (liraglutide), or Trulicity (dulaglutide).  . If your CBG is greater than 220 mg/dL, you may take  of your sliding scale (correction) dose of insulin.  Other Instructions:          Patient Signature:  Date:   Nurse  Signature:  Date:   Reviewed and Endorsed by [facility] Patient Education Committee, August 2015  Do not wear jewelry, make-up or nail polish.  Do not wear lotions, powders, or perfumes, or deoderant.  Do not shave 48 hours prior to surgery.  Men may shave face and neck.  Do not bring valuables to the hospital.  [facility] is not responsible for any belongings or valuables.  Contacts, dentures or bridgework may not be worn into surgery.  Leave your suitcase in the car.  After surgery it may be brought to your room.  For patients admitted to the hospital, discharge time will be determined by your treatment  team.  Patients discharged the day of surgery will not be allowed to drive home.    Special instructions: [facility] - Preparing for Surgery  Before surgery, you can play an important role.  Because skin is not sterile, your skin needs to be as free of germs as possible.  You can reduce the number of germs on you skin by washing with CHG (chlorahexidine gluconate) soap before surgery.  CHG is an antiseptic cleaner which kills germs and bonds with the skin to continue killing germs even after washing.  Please DO NOT use if you have an allergy to CHG or antibacterial soaps.  If your skin becomes reddened/irritated stop using the CHG and inform your nurse when you arrive at Short Stay.  Do not shave (including legs and underarms) for at least 48 hours prior to the first CHG shower.  You may shave your face.  Please follow these instructions carefully:   1.  Shower with CHG Soap the night before surgery and the                                morning of Surgery.  2.  If you choose to wash your hair, wash your hair first as usual with your       normal shampoo.  3.  After you shampoo, rinse your hair and body thoroughly to remove the                      Shampoo.  4.  Use CHG as you would any other liquid soap.  You can apply chg directly       to the skin and wash gently with scrungie or a clean washcloth.  5.  Apply the CHG Soap to your body ONLY FROM THE NECK DOWN.        Do not use on open wounds or open sores.  Avoid contact with your eyes,       ears, mouth and genitals (private parts).  Wash genitals (private parts)       with your normal soap.  6.  Wash thoroughly, paying special attention to the area where your surgery        will be performed.  7.  Thoroughly rinse your body with warm water from the neck down.  8.  DO NOT shower/wash with your normal soap after using and rinsing off       the CHG Soap.  9.  Pat yourself dry with a clean towel.            10.  Wear clean pajamas.             11.  Place clean sheets on your bed the night of your first shower and do  not        sleep with pets.  Day of Surgery  Do not apply any lotions/deoderants the morning of surgery.  Please wear clean clothes to the hospital/surgery center.     Please read over the following fact sheets that you were given. Pain Booklet, Coughing and Deep Breathing, MRSA Information and Surgical Site Infection Prevention

## 2017-05-01 ENCOUNTER — Encounter (HOSPITAL_COMMUNITY)
Admission: RE | Admit: 2017-05-01 | Discharge: 2017-05-01 | Disposition: A | Discharge status: Home / Self Care | Payer: 59 | Source: Ambulatory Visit | Attending: Neurosurgery | Admitting: Neurosurgery

## 2017-05-01 ENCOUNTER — Other Ambulatory Visit: Payer: Self-pay

## 2017-05-01 ENCOUNTER — Encounter (HOSPITAL_COMMUNITY): Payer: Self-pay | Admitting: *Deleted

## 2017-05-01 DIAGNOSIS — E669 Obesity, unspecified: Secondary | ICD-10-CM | POA: Diagnosis not present

## 2017-05-01 DIAGNOSIS — M5124 Other intervertebral disc displacement, thoracic region: Secondary | ICD-10-CM | POA: Insufficient documentation

## 2017-05-01 DIAGNOSIS — K219 Gastro-esophageal reflux disease without esophagitis: Secondary | ICD-10-CM | POA: Insufficient documentation

## 2017-05-01 DIAGNOSIS — I10 Essential (primary) hypertension: Secondary | ICD-10-CM | POA: Diagnosis not present

## 2017-05-01 DIAGNOSIS — E7800 Pure hypercholesterolemia, unspecified: Secondary | ICD-10-CM | POA: Diagnosis not present

## 2017-05-01 DIAGNOSIS — G4733 Obstructive sleep apnea (adult) (pediatric): Secondary | ICD-10-CM | POA: Insufficient documentation

## 2017-05-01 DIAGNOSIS — Z01810 Encounter for preprocedural cardiovascular examination: Secondary | ICD-10-CM | POA: Insufficient documentation

## 2017-05-01 DIAGNOSIS — Z7984 Long term (current) use of oral hypoglycemic drugs: Secondary | ICD-10-CM | POA: Insufficient documentation

## 2017-05-01 DIAGNOSIS — E119 Type 2 diabetes mellitus without complications: Secondary | ICD-10-CM | POA: Insufficient documentation

## 2017-05-01 DIAGNOSIS — Z01812 Encounter for preprocedural laboratory examination: Secondary | ICD-10-CM | POA: Insufficient documentation

## 2017-05-01 LAB — CBC
HCT: 40.2 % (ref 39.0–52.0)
Hemoglobin: 14.1 g/dL (ref 13.0–17.0)
MCH: 32.4 pg (ref 26.0–34.0)
MCHC: 35.1 g/dL (ref 30.0–36.0)
MCV: 92.4 fL (ref 78.0–100.0)
PLATELETS: 218 10*3/uL (ref 150–400)
RBC: 4.35 MIL/uL (ref 4.22–5.81)
RDW: 12.6 % (ref 11.5–15.5)
WBC: 6.3 10*3/uL (ref 4.0–10.5)

## 2017-05-01 LAB — BASIC METABOLIC PANEL
ANION GAP: 9 (ref 5–15)
BUN: 16 mg/dL (ref 6–20)
CALCIUM: 9.4 mg/dL (ref 8.9–10.3)
CO2: 26 mmol/L (ref 22–32)
Chloride: 102 mmol/L (ref 101–111)
Creatinine, Ser: 0.93 mg/dL (ref 0.61–1.24)
Glucose, Bld: 112 mg/dL — ABNORMAL HIGH (ref 65–99)
Potassium: 4.4 mmol/L (ref 3.5–5.1)
Sodium: 137 mmol/L (ref 135–145)

## 2017-05-01 LAB — SURGICAL PCR SCREEN
MRSA, PCR: NEGATIVE
STAPHYLOCOCCUS AUREUS: NEGATIVE

## 2017-05-01 LAB — HEMOGLOBIN A1C
Hgb A1c MFr Bld: 6.7 % — ABNORMAL HIGH (ref 4.8–5.6)
Mean Plasma Glucose: 145.59 mg/dL

## 2017-05-01 LAB — GLUCOSE, CAPILLARY: Glucose-Capillary: 113 mg/dL — ABNORMAL HIGH (ref 65–99)

## 2017-05-01 NOTE — Progress Notes (Addendum)
PCP is Monico Blitz (also manages his DM) Denies ever seeing a cardiologist.  Denies ever having a cath, stress test, or echo.  Denies any chest pain, fever, or cough. Reports fasting cbg's run 140's Instructed to bring CPAP mask on the day of surgery.  Pt reports he had chem in 2003 and had Bleomycin and was told my his Cancer Dr.  to let Anes know. George Wise called and informed.

## 2017-05-04 MED ORDER — DEXAMETHASONE SODIUM PHOSPHATE 10 MG/ML IJ SOLN: 10.0000 mg | INTRAMUSCULAR | Status: AC

## 2017-05-04 MED FILL — ESOMEPRAZOLE MAG DR 40 MG C: 40 | 90 days supply | Qty: 180 | Fill #1

## 2017-05-04 MED FILL — metFORMIN HCL 850 MG TABS: 850 | 90 days supply | Qty: 180 | Fill #0

## 2017-05-05 MED ORDER — DEXTROSE 5 % IV SOLN
3.0000 g | INTRAVENOUS | Status: AC
  Administered 2017-05-06: 3 g via INTRAVENOUS
  Filled 2017-05-05: qty 3000

## 2017-05-06 ENCOUNTER — Ambulatory Visit (HOSPITAL_COMMUNITY): Payer: 59

## 2017-05-06 ENCOUNTER — Encounter (HOSPITAL_COMMUNITY)
Admission: RE | Disposition: A | Discharge status: Home / Self Care | Payer: Self-pay | Source: Ambulatory Visit | Attending: Neurosurgery

## 2017-05-06 ENCOUNTER — Ambulatory Visit (HOSPITAL_COMMUNITY): Payer: 59 | Admitting: Emergency Medicine

## 2017-05-06 ENCOUNTER — Ambulatory Visit (HOSPITAL_COMMUNITY)
Admission: RE | Admit: 2017-05-06 | Discharge: 2017-05-08 | Disposition: A | Discharge status: Home / Self Care | Payer: 59 | Source: Ambulatory Visit | Attending: Neurosurgery | Admitting: Neurosurgery

## 2017-05-06 ENCOUNTER — Ambulatory Visit (HOSPITAL_COMMUNITY): Payer: 59 | Admitting: Anesthesiology

## 2017-05-06 ENCOUNTER — Encounter (HOSPITAL_COMMUNITY): Payer: Self-pay | Admitting: General Practice

## 2017-05-06 DIAGNOSIS — M549 Dorsalgia, unspecified: Secondary | ICD-10-CM | POA: Diagnosis not present

## 2017-05-06 DIAGNOSIS — Z7984 Long term (current) use of oral hypoglycemic drugs: Secondary | ICD-10-CM

## 2017-05-06 DIAGNOSIS — E119 Type 2 diabetes mellitus without complications: Secondary | ICD-10-CM

## 2017-05-06 DIAGNOSIS — R110 Nausea: Secondary | ICD-10-CM | POA: Diagnosis not present

## 2017-05-06 DIAGNOSIS — I10 Essential (primary) hypertension: Secondary | ICD-10-CM

## 2017-05-06 DIAGNOSIS — Z419 Encounter for procedure for purposes other than remedying health state, unspecified: Secondary | ICD-10-CM

## 2017-05-06 DIAGNOSIS — K219 Gastro-esophageal reflux disease without esophagitis: Secondary | ICD-10-CM

## 2017-05-06 DIAGNOSIS — M5114 Intervertebral disc disorders with radiculopathy, thoracic region: Secondary | ICD-10-CM

## 2017-05-06 DIAGNOSIS — Z79899 Other long term (current) drug therapy: Secondary | ICD-10-CM

## 2017-05-06 DIAGNOSIS — Z6836 Body mass index (BMI) 36.0-36.9, adult: Secondary | ICD-10-CM

## 2017-05-06 DIAGNOSIS — M47816 Spondylosis without myelopathy or radiculopathy, lumbar region: Secondary | ICD-10-CM | POA: Diagnosis not present

## 2017-05-06 DIAGNOSIS — M5104 Intervertebral disc disorders with myelopathy, thoracic region: Secondary | ICD-10-CM

## 2017-05-06 DIAGNOSIS — E669 Obesity, unspecified: Secondary | ICD-10-CM | POA: Insufficient documentation

## 2017-05-06 DIAGNOSIS — M5124 Other intervertebral disc displacement, thoracic region: Secondary | ICD-10-CM | POA: Diagnosis present

## 2017-05-06 DIAGNOSIS — Z23 Encounter for immunization: Secondary | ICD-10-CM | POA: Diagnosis not present

## 2017-05-06 DIAGNOSIS — G8918 Other acute postprocedural pain: Secondary | ICD-10-CM | POA: Diagnosis not present

## 2017-05-06 DIAGNOSIS — E7800 Pure hypercholesterolemia, unspecified: Secondary | ICD-10-CM | POA: Diagnosis not present

## 2017-05-06 DIAGNOSIS — E785 Hyperlipidemia, unspecified: Secondary | ICD-10-CM | POA: Insufficient documentation

## 2017-05-06 DIAGNOSIS — G4733 Obstructive sleep apnea (adult) (pediatric): Secondary | ICD-10-CM | POA: Insufficient documentation

## 2017-05-06 DIAGNOSIS — M545 Low back pain: Secondary | ICD-10-CM | POA: Diagnosis not present

## 2017-05-06 HISTORY — DX: Nausea with vomiting, unspecified: Z98.890

## 2017-05-06 HISTORY — DX: Unspecified osteoarthritis, unspecified site: M19.90

## 2017-05-06 HISTORY — PX: LUMBAR LAMINECTOMY/DECOMPRESSION MICRODISCECTOMY: SHX5026

## 2017-05-06 HISTORY — DX: Nausea with vomiting, unspecified: R11.2

## 2017-05-06 HISTORY — DX: Type 2 diabetes mellitus without complications: E11.9

## 2017-05-06 HISTORY — DX: Sleep apnea, unspecified: G47.30

## 2017-05-06 LAB — GLUCOSE, CAPILLARY
GLUCOSE-CAPILLARY: 118 mg/dL — AB (ref 65–99)
GLUCOSE-CAPILLARY: 108 mg/dL — AB (ref 65–99)
Glucose-Capillary: 118 mg/dL — ABNORMAL HIGH (ref 65–99)
Glucose-Capillary: 232 mg/dL — ABNORMAL HIGH (ref 65–99)

## 2017-05-06 SURGERY — LUMBAR LAMINECTOMY/DECOMPRESSION MICRODISCECTOMY 1 LEVEL
Anesthesia: General | Site: Back

## 2017-05-06 MED ORDER — LIDOCAINE-EPINEPHRINE 1 %-1:100000 IJ SOLN
INTRAMUSCULAR | Status: DC | PRN
  Administered 2017-05-06: 10 mL

## 2017-05-06 MED ORDER — CYCLOBENZAPRINE HCL 10 MG PO TABS
10.0000 mg | ORAL_TABLET | Freq: Three times a day (TID) | ORAL | Status: DC | PRN
  Administered 2017-05-06: 10 mg via ORAL
  Filled 2017-05-06: qty 1

## 2017-05-06 MED ORDER — PROPOFOL 10 MG/ML IV BOLUS
INTRAVENOUS | Status: AC
  Filled 2017-05-06: qty 40

## 2017-05-06 MED ORDER — PANTOPRAZOLE SODIUM 40 MG PO TBEC
40.0000 mg | DELAYED_RELEASE_TABLET | Freq: Every day | ORAL | Status: DC
  Administered 2017-05-06 – 2017-05-07 (×2): 40 mg via ORAL
  Filled 2017-05-06 (×2): qty 1

## 2017-05-06 MED ORDER — THROMBIN (RECOMBINANT) 5000 UNITS EX SOLR
CUTANEOUS | Status: AC
  Filled 2017-05-06: qty 15000

## 2017-05-06 MED ORDER — HYDROMORPHONE HCL 1 MG/ML IJ SOLN
INTRAMUSCULAR | Status: AC
  Administered 2017-05-06: 0.5 mg via INTRAVENOUS
  Filled 2017-05-06: qty 1

## 2017-05-06 MED ORDER — MIDAZOLAM HCL 5 MG/5ML IJ SOLN
INTRAMUSCULAR | Status: DC | PRN
Start: 2017-05-06 — End: 2017-05-06
  Administered 2017-05-06: 2 mg via INTRAVENOUS

## 2017-05-06 MED ORDER — LACTATED RINGERS IV SOLN
INTRAVENOUS | Status: DC
  Administered 2017-05-06 (×3): via INTRAVENOUS

## 2017-05-06 MED ORDER — MEPERIDINE HCL 25 MG/ML IJ SOLN: 6.2500 mg | INTRAMUSCULAR | Status: DC | PRN

## 2017-05-06 MED ORDER — HYDROMORPHONE HCL 1 MG/ML IJ SOLN
0.5000 mg | INTRAMUSCULAR | Status: DC | PRN
  Administered 2017-05-06 – 2017-05-07 (×3): 0.5 mg via INTRAVENOUS
  Filled 2017-05-06 (×3): qty 0.5

## 2017-05-06 MED ORDER — BUPIVACAINE HCL (PF) 0.25 % IJ SOLN
INTRAMUSCULAR | Status: AC
  Filled 2017-05-06: qty 30

## 2017-05-06 MED ORDER — PROMETHAZINE HCL 25 MG/ML IJ SOLN: 6.2500 mg | INTRAMUSCULAR | Status: DC | PRN

## 2017-05-06 MED ORDER — SODIUM CHLORIDE 0.9 % IR SOLN
Status: DC | PRN
  Administered 2017-05-06: 500 mL

## 2017-05-06 MED ORDER — DEXAMETHASONE SODIUM PHOSPHATE 10 MG/ML IJ SOLN
INTRAMUSCULAR | Status: DC | PRN
  Administered 2017-05-06: 10 mg via INTRAVENOUS

## 2017-05-06 MED ORDER — SODIUM CHLORIDE 0.9% FLUSH
3.0000 mL | INTRAVENOUS | Status: DC | PRN
Start: 2017-05-06 — End: 2017-05-08

## 2017-05-06 MED ORDER — MENTHOL 3 MG MT LOZG: 1.0000 | LOZENGE | OROMUCOSAL | Status: DC | PRN

## 2017-05-06 MED ORDER — PHENYLEPHRINE 40 MCG/ML (10ML) SYRINGE FOR IV PUSH (FOR BLOOD PRESSURE SUPPORT)
PREFILLED_SYRINGE | INTRAVENOUS | Status: AC
  Filled 2017-05-06: qty 10

## 2017-05-06 MED ORDER — ONDANSETRON HCL 4 MG/2ML IJ SOLN
4.0000 mg | Freq: Four times a day (QID) | INTRAMUSCULAR | Status: DC | PRN
  Administered 2017-05-07: 4 mg via INTRAVENOUS
  Filled 2017-05-06: qty 2

## 2017-05-06 MED ORDER — THROMBIN (RECOMBINANT) 5000 UNITS EX SOLR
CUTANEOUS | Status: DC | PRN
  Administered 2017-05-06 (×2): 5000 [IU] via TOPICAL

## 2017-05-06 MED ORDER — CEFAZOLIN SODIUM-DEXTROSE 2-4 GM/100ML-% IV SOLN
2.0000 g | Freq: Three times a day (TID) | INTRAVENOUS | Status: AC
  Administered 2017-05-06 – 2017-05-07 (×2): 2 g via INTRAVENOUS
  Filled 2017-05-06 (×2): qty 100

## 2017-05-06 MED ORDER — OXYCODONE HCL 5 MG PO TABS: 5.0000 mg | ORAL_TABLET | Freq: Once | ORAL | Status: DC | PRN

## 2017-05-06 MED ORDER — ROCURONIUM BROMIDE 10 MG/ML (PF) SYRINGE
PREFILLED_SYRINGE | INTRAVENOUS | Status: AC
  Filled 2017-05-06: qty 10

## 2017-05-06 MED ORDER — SODIUM CHLORIDE 0.9% FLUSH
3.0000 mL | Freq: Two times a day (BID) | INTRAVENOUS | Status: DC
  Administered 2017-05-07 (×2): 3 mL via INTRAVENOUS

## 2017-05-06 MED ORDER — LIDOCAINE HCL (CARDIAC) 20 MG/ML IV SOLN
INTRAVENOUS | Status: DC | PRN
  Administered 2017-05-06: 100 mg via INTRAVENOUS

## 2017-05-06 MED ORDER — PANTOPRAZOLE SODIUM 40 MG IV SOLR: 40.0000 mg | Freq: Every day | INTRAVENOUS | Status: DC

## 2017-05-06 MED ORDER — HEMOSTATIC AGENTS (NO CHARGE) OPTIME
TOPICAL | Status: DC | PRN
  Administered 2017-05-06: 1 via TOPICAL

## 2017-05-06 MED ORDER — ONDANSETRON HCL 4 MG PO TABS
4.0000 mg | ORAL_TABLET | Freq: Four times a day (QID) | ORAL | Status: DC | PRN
  Administered 2017-05-06 – 2017-05-07 (×3): 4 mg via ORAL
  Filled 2017-05-06 (×3): qty 1

## 2017-05-06 MED ORDER — METFORMIN HCL 850 MG PO TABS
850.0000 mg | ORAL_TABLET | Freq: Two times a day (BID) | ORAL | Status: DC
  Filled 2017-05-06: qty 1

## 2017-05-06 MED ORDER — ATORVASTATIN CALCIUM 20 MG PO TABS
40.0000 mg | ORAL_TABLET | Freq: Every day | ORAL | Status: DC
  Administered 2017-05-06: 40 mg via ORAL

## 2017-05-06 MED ORDER — NAPROXEN SODIUM 275 MG PO TABS: 440.0000 mg | ORAL_TABLET | Freq: Every day | ORAL | Status: DC | PRN

## 2017-05-06 MED ORDER — ACETAMINOPHEN 650 MG RE SUPP: 650.0000 mg | RECTAL | Status: DC | PRN

## 2017-05-06 MED ORDER — HYDROCODONE-ACETAMINOPHEN 5-325 MG PO TABS: 1.0000 | ORAL_TABLET | ORAL | Status: DC | PRN

## 2017-05-06 MED ORDER — PAROXETINE HCL 10 MG PO TABS
10.0000 mg | ORAL_TABLET | Freq: Every morning | ORAL | Status: DC
  Administered 2017-05-07 – 2017-05-08 (×2): 10 mg via ORAL
  Filled 2017-05-06 (×2): qty 1

## 2017-05-06 MED ORDER — SUGAMMADEX SODIUM 500 MG/5ML IV SOLN
INTRAVENOUS | Status: DC | PRN
  Administered 2017-05-06: 260 mg via INTRAVENOUS

## 2017-05-06 MED ORDER — LISINOPRIL 20 MG PO TABS
40.0000 mg | ORAL_TABLET | Freq: Every evening | ORAL | Status: DC
  Administered 2017-05-06 – 2017-05-07 (×2): 40 mg via ORAL
  Filled 2017-05-06 (×2): qty 2

## 2017-05-06 MED ORDER — CHLORHEXIDINE GLUCONATE CLOTH 2 % EX PADS: 6.0000 | MEDICATED_PAD | Freq: Once | CUTANEOUS | Status: DC

## 2017-05-06 MED ORDER — PROPOFOL 10 MG/ML IV BOLUS
INTRAVENOUS | Status: DC | PRN
  Administered 2017-05-06: 200 mg via INTRAVENOUS

## 2017-05-06 MED ORDER — SUGAMMADEX SODIUM 500 MG/5ML IV SOLN
INTRAVENOUS | Status: AC
  Filled 2017-05-06: qty 5

## 2017-05-06 MED ORDER — LIDOCAINE HCL (CARDIAC) 20 MG/ML IV SOLN: INTRAVENOUS | Status: DC | PRN

## 2017-05-06 MED ORDER — LIDOCAINE-EPINEPHRINE 1 %-1:100000 IJ SOLN
INTRAMUSCULAR | Status: AC
  Filled 2017-05-06: qty 1

## 2017-05-06 MED ORDER — PHENOL 1.4 % MT LIQD: 1.0000 | OROMUCOSAL | Status: DC | PRN

## 2017-05-06 MED ORDER — BUPIVACAINE HCL (PF) 0.25 % IJ SOLN
INTRAMUSCULAR | Status: DC | PRN
  Administered 2017-05-06: 10 mL

## 2017-05-06 MED ORDER — ROCURONIUM BROMIDE 100 MG/10ML IV SOLN
INTRAVENOUS | Status: DC | PRN
  Administered 2017-05-06: 50 mg via INTRAVENOUS
  Administered 2017-05-06: 20 mg via INTRAVENOUS
  Administered 2017-05-06: 30 mg via INTRAVENOUS

## 2017-05-06 MED ORDER — ONDANSETRON HCL 4 MG/2ML IJ SOLN
INTRAMUSCULAR | Status: AC
  Filled 2017-05-06: qty 2

## 2017-05-06 MED ORDER — FENTANYL CITRATE (PF) 250 MCG/5ML IJ SOLN
INTRAMUSCULAR | Status: AC
  Filled 2017-05-06: qty 5

## 2017-05-06 MED ORDER — ALUM & MAG HYDROXIDE-SIMETH 200-200-20 MG/5ML PO SUSP
30.0000 mL | Freq: Four times a day (QID) | ORAL | Status: DC | PRN
  Administered 2017-05-07 (×3): 30 mL via ORAL
  Filled 2017-05-06 (×3): qty 30

## 2017-05-06 MED ORDER — METAXALONE 800 MG PO TABS: 800.0000 mg | ORAL_TABLET | Freq: Three times a day (TID) | ORAL | Status: DC | PRN

## 2017-05-06 MED ORDER — PANTOPRAZOLE SODIUM 40 MG PO TBEC: 40.0000 mg | DELAYED_RELEASE_TABLET | Freq: Every day | ORAL | Status: DC

## 2017-05-06 MED ORDER — FENTANYL CITRATE (PF) 100 MCG/2ML IJ SOLN
INTRAMUSCULAR | Status: DC | PRN
  Administered 2017-05-06: 100 ug via INTRAVENOUS
  Administered 2017-05-06: 50 ug via INTRAVENOUS

## 2017-05-06 MED ORDER — MIDAZOLAM HCL 2 MG/2ML IJ SOLN
INTRAMUSCULAR | Status: AC
  Filled 2017-05-06: qty 2

## 2017-05-06 MED ORDER — METFORMIN HCL 850 MG PO TABS
850.0000 mg | ORAL_TABLET | Freq: Two times a day (BID) | ORAL | Status: DC
  Administered 2017-05-06 – 2017-05-08 (×4): 850 mg via ORAL
  Filled 2017-05-06 (×6): qty 1

## 2017-05-06 MED ORDER — HYDROMORPHONE HCL 1 MG/ML IJ SOLN
0.2500 mg | INTRAMUSCULAR | Status: DC | PRN
  Administered 2017-05-06 (×4): 0.5 mg via INTRAVENOUS

## 2017-05-06 MED ORDER — ONDANSETRON HCL 4 MG/2ML IJ SOLN
INTRAMUSCULAR | Status: DC | PRN
  Administered 2017-05-06: 4 mg via INTRAVENOUS

## 2017-05-06 MED ORDER — 0.9 % SODIUM CHLORIDE (POUR BTL) OPTIME
TOPICAL | Status: DC | PRN
  Administered 2017-05-06: 1000 mL

## 2017-05-06 MED ORDER — OXYCODONE HCL 5 MG PO TABS
5.0000 mg | ORAL_TABLET | ORAL | Status: DC | PRN
  Administered 2017-05-06: 5 mg via ORAL
  Filled 2017-05-06 (×2): qty 1

## 2017-05-06 MED ORDER — ACETAMINOPHEN 325 MG PO TABS
650.0000 mg | ORAL_TABLET | ORAL | Status: DC | PRN
  Administered 2017-05-06: 650 mg via ORAL
  Filled 2017-05-06: qty 2

## 2017-05-06 MED ORDER — OXYCODONE HCL 5 MG/5ML PO SOLN: 5.0000 mg | Freq: Once | ORAL | Status: DC | PRN

## 2017-05-06 SURGICAL SUPPLY — 57 items
ADH SKN CLS APL DERMABOND .7 (GAUZE/BANDAGES/DRESSINGS) ×1
APL SKNCLS STERI-STRIP NONHPOA (GAUZE/BANDAGES/DRESSINGS) ×1
BAG DECANTER FOR FLEXI CONT (MISCELLANEOUS) ×3 IMPLANT
BENZOIN TINCTURE PRP APPL 2/3 (GAUZE/BANDAGES/DRESSINGS) ×3 IMPLANT
BLADE CLIPPER SURG (BLADE) ×2 IMPLANT
BLADE SURG 11 STRL SS (BLADE) ×3 IMPLANT
BUR CUTTER 7.0 ROUND (BURR) ×3 IMPLANT
BUR MATCHSTICK NEURO 3.0 LAGG (BURR) ×3 IMPLANT
CANISTER SUCT 3000ML PPV (MISCELLANEOUS) ×3 IMPLANT
CARTRIDGE OIL MAESTRO DRILL (MISCELLANEOUS) ×1 IMPLANT
CLOSURE WOUND 1/2 X4 (GAUZE/BANDAGES/DRESSINGS) ×1
DECANTER SPIKE VIAL GLASS SM (MISCELLANEOUS) ×3 IMPLANT
DERMABOND ADVANCED (GAUZE/BANDAGES/DRESSINGS) ×2
DERMABOND ADVANCED .7 DNX12 (GAUZE/BANDAGES/DRESSINGS) ×1 IMPLANT
DIFFUSER DRILL AIR PNEUMATIC (MISCELLANEOUS) ×3 IMPLANT
DRAPE HALF SHEET 40X57 (DRAPES) ×2 IMPLANT
DRAPE LAPAROTOMY 100X72X124 (DRAPES) ×3 IMPLANT
DRAPE MICROSCOPE LEICA (MISCELLANEOUS) ×3 IMPLANT
DRAPE POUCH INSTRU U-SHP 10X18 (DRAPES) ×3 IMPLANT
DRAPE SURG 17X23 STRL (DRAPES) ×3 IMPLANT
DRSG OPSITE POSTOP 4X6 (GAUZE/BANDAGES/DRESSINGS) ×2 IMPLANT
DURAPREP 26ML APPLICATOR (WOUND CARE) ×3 IMPLANT
ELECT REM PT RETURN 9FT ADLT (ELECTROSURGICAL) ×3
ELECTRODE REM PT RTRN 9FT ADLT (ELECTROSURGICAL) ×1 IMPLANT
GAUZE SPONGE 4X4 12PLY STRL (GAUZE/BANDAGES/DRESSINGS) ×3 IMPLANT
GAUZE SPONGE 4X4 16PLY XRAY LF (GAUZE/BANDAGES/DRESSINGS) IMPLANT
GLOVE BIO SURGEON STRL SZ7 (GLOVE) ×2 IMPLANT
GLOVE BIO SURGEON STRL SZ8 (GLOVE) ×3 IMPLANT
GLOVE BIOGEL PI IND STRL 7.0 (GLOVE) IMPLANT
GLOVE BIOGEL PI INDICATOR 7.0 (GLOVE) ×2
GLOVE EXAM NITRILE LRG STRL (GLOVE) IMPLANT
GLOVE EXAM NITRILE XL STR (GLOVE) IMPLANT
GLOVE EXAM NITRILE XS STR PU (GLOVE) IMPLANT
GLOVE INDICATOR 8.5 STRL (GLOVE) ×3 IMPLANT
GOWN STRL REUS W/ TWL LRG LVL3 (GOWN DISPOSABLE) ×1 IMPLANT
GOWN STRL REUS W/ TWL XL LVL3 (GOWN DISPOSABLE) ×2 IMPLANT
GOWN STRL REUS W/TWL 2XL LVL3 (GOWN DISPOSABLE) IMPLANT
GOWN STRL REUS W/TWL LRG LVL3 (GOWN DISPOSABLE) ×6
GOWN STRL REUS W/TWL XL LVL3 (GOWN DISPOSABLE) ×6
KIT BASIN OR (CUSTOM PROCEDURE TRAY) ×3 IMPLANT
KIT ROOM TURNOVER OR (KITS) ×3 IMPLANT
NDL SPNL 22GX3.5 QUINCKE BK (NEEDLE) ×1 IMPLANT
NEEDLE HYPO 22GX1.5 SAFETY (NEEDLE) ×3 IMPLANT
NEEDLE SPNL 22GX3.5 QUINCKE BK (NEEDLE) ×3 IMPLANT
NS IRRIG 1000ML POUR BTL (IV SOLUTION) ×3 IMPLANT
OIL CARTRIDGE MAESTRO DRILL (MISCELLANEOUS) ×3
PACK LAMINECTOMY NEURO (CUSTOM PROCEDURE TRAY) ×3 IMPLANT
RUBBERBAND STERILE (MISCELLANEOUS) ×6 IMPLANT
SPONGE SURGIFOAM ABS GEL SZ50 (HEMOSTASIS) ×3 IMPLANT
STRIP CLOSURE SKIN 1/2X4 (GAUZE/BANDAGES/DRESSINGS) ×2 IMPLANT
SUT VIC AB 0 CT1 18XCR BRD8 (SUTURE) ×1 IMPLANT
SUT VIC AB 0 CT1 8-18 (SUTURE) ×3
SUT VIC AB 2-0 CT1 18 (SUTURE) ×3 IMPLANT
SUT VICRYL 4-0 PS2 18IN ABS (SUTURE) ×3 IMPLANT
TOWEL GREEN STERILE (TOWEL DISPOSABLE) ×3 IMPLANT
TOWEL GREEN STERILE FF (TOWEL DISPOSABLE) ×3 IMPLANT
WATER STERILE IRR 1000ML POUR (IV SOLUTION) ×3 IMPLANT

## 2017-05-06 NOTE — Anesthesia Preprocedure Evaluation (Signed)
Anesthesia Evaluation  Patient identified by MRN, date of birth, ID band Patient awake    Reviewed: Allergy & Precautions, NPO status , Patient's Chart, lab work & pertinent test results  History of Anesthesia Complications (+) PONV  Airway Mallampati: II  TM Distance: >3 FB Neck ROM: Full    Dental no notable dental hx.    Pulmonary neg pulmonary ROS, sleep apnea ,    Pulmonary exam normal breath sounds clear to auscultation       Cardiovascular hypertension, Pt. on medications negative cardio ROS Normal cardiovascular exam Rhythm:Regular Rate:Normal     Neuro/Psych negative neurological ROS  negative psych ROS   GI/Hepatic negative GI ROS, Neg liver ROS, GERD  ,  Endo/Other  negative endocrine ROSdiabetes, Type 2, Oral Hypoglycemic Agents  Renal/GU negative Renal ROS  negative genitourinary   Musculoskeletal negative musculoskeletal ROS (+) Arthritis , Osteoarthritis,    Abdominal   Peds negative pediatric ROS (+)  Hematology negative hematology ROS (+)   Anesthesia Other Findings   Reproductive/Obstetrics negative OB ROS                             Anesthesia Physical Anesthesia Plan  ASA: II  Anesthesia Plan: General   Post-op Pain Management:    Induction: Intravenous  PONV Risk Score and Plan: 2 and Ondansetron and Midazolam  Airway Management Planned: Oral ETT  Additional Equipment:   Intra-op Plan:   Post-operative Plan: Extubation in OR  Informed Consent: I have reviewed the patients History and Physical, chart, labs and discussed the procedure including the risks, benefits and alternatives for the proposed anesthesia with the patient or authorized representative who has indicated his/her understanding and acceptance.   Dental advisory given  Plan Discussed with: CRNA  Anesthesia Plan Comments:         Anesthesia Quick Evaluation

## 2017-05-06 NOTE — Op Note (Signed)
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    postoperative diagnosis: Same  Procedure:transpedicular decompression discectomy T10-T11 from the left with microdissection of the left T11 nerve root microscopic discectomy  Surgeon: Dominica Severin Riki Berninger  Asst.: Jovita Gamma  Anesthesia: Gen.  EBL: Minimal  History of present illness: Patient is a very pleasant 4 increased left leg pain as well as weakness in his left leg 1 ambulate. Workup revealed a large disc herniation causing severe cord compression at T10-T11. Due to patient's progression of clinical syndrome imaging findings and failure conservative treatment I recommended microdiscectomy and transpedicular decompression at T10-T11. I extensively went over the risks and benefits of the operation with the patient as well as perioperative course expectations of outcome and alternatives of surgery and he understood and agreed to proceed forward.  Operative procedure: Patient brought into the or was induced on general anesthesia positioned prone the Wilson frame his back was prepped and draped in routine sterile fashion preoperative x-ray localized the T12 pedicle so attention was taken to interspace above this and confirmed with desiccation of the T11 pedicle and T10-11 disc space. So using a high-speed drill the inferior aspect of lamina of T10 medial facet complex super aspect of lamina of T11 was drilled out with high-speed drill there was a large spur coming off the facet complexes was teased off the dura and removed in piecemeal fashion with a 1 and 2 mm Kerrison punch. I then identified the disc space quite the epidural veins identify the T11 pedicle and drilled through the pedicle into the disc space to create access into the lateral aspect the space. I then incised the disc space with a scalpel and removed several large fragments of disc. Part of it was partially calcified  utilizing an Epstein curet and S and the curettes a posterior calcified spur away from the spinal cord and removed in piecemeal fashion. At the discectomy there is no further stenosis no further compression spinal cord or T11 nerve root. Wound scope was irrigated meticulous hemostasis was maintained Gelfoam was opened up the dura the muscle fascia pressure layers with after Vicryl skin is closed running 4 subcuticular Dermabond benzo and Steri-Strips and a sterile dressing was applied patient recovered in stable condition. At the end the case all needle counts and sponge counts were correct.

## 2017-05-06 NOTE — Anesthesia Procedure Notes (Signed)
Procedure Name: Intubation Date/Time: 05/06/2017 2:12 PM Performed by: Scheryl Darter Pre-anesthesia Checklist: Patient identified, Emergency Drugs available, Suction available and Patient being monitored Patient Re-evaluated:Patient Re-evaluated prior to induction Oxygen Delivery Method: Circle System Utilized Preoxygenation: Pre-oxygenation with 100% oxygen Induction Type: IV induction Ventilation: Mask ventilation without difficulty Laryngoscope Size: Miller and 3 Grade View: Grade I Tube type: Oral Tube size: 8.0 mm Number of attempts: 1 Airway Equipment and Method: Stylet and Oral airway Placement Confirmation: ETT inserted through vocal cords under direct vision,  positive ETCO2 and breath sounds checked- equal and bilateral Secured at: 24 cm Tube secured with: Tape Dental Injury: Teeth and Oropharynx as per pre-operative assessment

## 2017-05-06 NOTE — Transfer of Care (Signed)
Immediate Anesthesia Transfer of Care Note  Patient: George Wise  Procedure(s) Performed: Microdiscectomy - Thoracic ten -Thoracic eleven  transpedicular (N/A Back)  Patient Location: PACU  Anesthesia Type:General  Level of Consciousness: drowsy and patient cooperative  Airway & Oxygen Therapy: Patient Spontanous Breathing and Patient connected to nasal cannula oxygen  Post-op Assessment: Report given to RN and Patient moving all extremities X 4  Post vital signs: Reviewed and stable  Last Vitals:  Vitals:   05/06/17 1205  BP: (!) 155/90  Pulse: 74  Temp: 36.7 C  SpO2: 98%    Last Pain:  Vitals:   05/06/17 1215  TempSrc:   PainSc: 1       Patients Stated Pain Goal: 2 (59/93/57 0177)  Complications: No apparent anesthesia complications

## 2017-05-06 NOTE — H&P (Signed)
George Wise is an 46 y.o. male.   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 Due to patient's failed conservative treatment imaging findings and progressive clinical syndrome I recommended transpedicular discectomy at T10-11 on the left. I've extensively gone over the risks and benefits of that operation with him as well as perioperative course expectations of outcome and alternatives of surgeryand he understood and agreed to proceed forward.  Past Medical History:  Diagnosis Date  . Arthritis   . Diabetes mellitus without complication ([place])   . GERD (gastroesophageal reflux disease)   . Glucose intolerance (impaired glucose tolerance)   . Hyperlipidemia   . Hypertension   . Obesity   . OSA (obstructive sleep apnea) 02/17/2011  . PONV (postoperative nausea and vomiting)   . Sleep apnea   . Testicular cancer ([place]) 2001    Past Surgical History:  Procedure Laterality Date  . HERNIA REPAIR    . lymph node removal  2001   from adb. -20  . PLANTAR FASCIA RELEASE  09/2013  . TENDON RELEASE  05/2011   left foot  . TESTICLE SURGERY  2001   resection for cancer left    Family History  Problem Relation Age of Onset  . Diabetes Paternal Grandfather   . Stroke Paternal Grandfather   . Ovarian cancer Paternal Grandmother   . Stomach cancer Paternal Grandmother    Social History:  reports that he has never smoked. He has never used smokeless tobacco. He reports that he does not drink alcohol or use drugs.  Allergies: No Known Allergies  Medications Prior to Admission  Medication Sig Dispense Refill  . atorvastatin (LIPITOR) 40 MG tablet TAKE 1 TABLET BY MOUTH DAILY. (Patient taking differently: TAKE 1 TABLET BY MOUTH IN THE EVENING) 90 tablet 3  . lisinopril  (PRINIVIL,ZESTRIL) 40 MG tablet TAKE 1 TABLET BY MOUTH DAILY. (Patient taking differently: Take 40 mg by mouth every evening. ) 90 tablet 3  . metFORMIN (GLUCOPHAGE) 850 MG tablet Take 850 mg by mouth 2 (two) times daily with a meal.    . naproxen sodium (ANAPROX) 220 MG tablet Take 440 mg by mouth daily as needed (PAIN).    Marland Kitchen NEXIUM 40 MG capsule TAKE 1 CAPSULE BY MOUTH 2 TIMES DAILY BEFORE A MEAL 180 capsule 3  . PARoxetine (PAXIL) 10 MG tablet TAKE 1 TABLET BY MOUTH EVERY MORNING. (Patient taking differently: TAKE 1 TABLET BY MOUTH EVERY IN THE EVENING) 90 tablet 3  . doxycycline (VIBRAMYCIN) 100 MG capsule Take 1 capsule (100 mg total) by mouth 2 (two) times daily. (Patient not taking: Reported on 04/24/2016) 20 capsule 0  . HYDROcodone-acetaminophen (NORCO/VICODIN) 5-325 MG tablet Take 1-2 tablets by mouth every 4 (four) hours as needed. (Patient taking differently: Take 1 tablet by mouth every 4 (four) hours as needed for severe pain. ) 10 tablet 0  . metaxalone (SKELAXIN) 800 MG tablet Take 1 tablet (800 mg total) by mouth 3 (three) times daily as needed for muscle spasms. 10 tablet 0  . ondansetron (ZOFRAN ODT) 4 MG disintegrating tablet Take 1 tablet (4 mg total) by mouth every 8 (eight) hours as needed for nausea or vomiting. (Patient not taking: Reported on 04/24/2016) 12 tablet 0  . ondansetron (ZOFRAN) 4 MG tablet Take 1 tablet (4  mg total) by mouth every 6 (six) hours. (Patient not taking: Reported on 04/24/2016) 12 tablet 0  . traMADol (ULTRAM) 50 MG tablet Take 1 tablet (50 mg total) by mouth every 12 (twelve) hours as needed for severe pain. (Patient not taking: Reported on 04/28/2017) 10 tablet 0    Results for orders placed or performed during the hospital encounter of 05/06/17 (from the past 48 hour(s))  Glucose, capillary     Status: Abnormal   Collection Time: 05/06/17 12:08 PM  Result Value Ref Range   Glucose-Capillary 118 (H) 65 - 99 mg/dL   Comment 1 Notify RN    Comment  2 Document in Chart   Glucose, capillary     Status: Abnormal   Collection Time: 05/06/17  1:27 PM  Result Value Ref Range   Glucose-Capillary 108 (H) 65 - 99 mg/dL   No results found.  Review of Systems  Musculoskeletal: Positive for back pain and myalgias.  Neurological: Positive for tingling, sensory change and focal weakness.    Blood pressure (!) 155/90, pulse 74, temperature 98.1 F (36.7 C), temperature source Oral, height 6\' 2"  (1.88 m), weight 127.5 kg (281 lb), SpO2 98 %. Physical Exam  Constitutional: He is oriented to person, place, and time. He appears well-developed and well-nourished.  HENT:  Head: Normocephalic.  Eyes: Pupils are equal, round, and reactive to light.  Neck: Normal range of motion.  Respiratory: Effort normal.  GI: Soft. Bowel sounds are normal.  Neurological: He is alert and oriented to person, place, and time. He has normal strength. GCS eye subscore is 4. GCS verbal subscore is 5. GCS motor subscore is 6.  Strength is 5 out of 5 iliopsoas, quads, hamstrings, gastroc, Ant tibialis, and EHL.     Assessment/Plan 46 year old presents for T10-11 transpedicular discectomy on the left.  Leslie Langille P, MD 05/06/2017, 1:37 PM

## 2017-05-07 ENCOUNTER — Encounter (HOSPITAL_COMMUNITY): Payer: Self-pay | Admitting: Neurosurgery

## 2017-05-07 LAB — GLUCOSE, CAPILLARY
GLUCOSE-CAPILLARY: 189 mg/dL — AB (ref 65–99)
GLUCOSE-CAPILLARY: 116 mg/dL — AB (ref 65–99)
Glucose-Capillary: 122 mg/dL — ABNORMAL HIGH (ref 65–99)
Glucose-Capillary: 207 mg/dL — ABNORMAL HIGH (ref 65–99)

## 2017-05-07 MED ORDER — HYDROXYZINE HCL 50 MG/ML IM SOLN
50.0000 mg | Freq: Four times a day (QID) | INTRAMUSCULAR | Status: DC | PRN
Start: 2017-05-07 — End: 2017-05-08
  Administered 2017-05-07: 50 mg via INTRAMUSCULAR
  Filled 2017-05-07: qty 1

## 2017-05-07 MED ORDER — OXYCODONE-ACETAMINOPHEN 5-325 MG PO TABS: 1.0000 | ORAL_TABLET | ORAL | 0 refills | Status: DC | PRN

## 2017-05-07 MED ORDER — METHOCARBAMOL 750 MG PO TABS: 750.0000 mg | ORAL_TABLET | Freq: Four times a day (QID) | ORAL | 1 refills | Status: DC | PRN

## 2017-05-07 MED ORDER — OXYCODONE-ACETAMINOPHEN 5-325 MG PO TABS
1.0000 | ORAL_TABLET | ORAL | Status: DC | PRN
  Administered 2017-05-07 – 2017-05-08 (×8): 2 via ORAL
  Filled 2017-05-07 (×8): qty 2

## 2017-05-07 MED ORDER — OXYCODONE HCL 5 MG PO TABS
5.0000 mg | ORAL_TABLET | ORAL | Status: DC | PRN
  Administered 2017-05-07: 10 mg via ORAL
  Filled 2017-05-07: qty 1

## 2017-05-07 MED ORDER — METHOCARBAMOL 750 MG PO TABS
750.0000 mg | ORAL_TABLET | Freq: Four times a day (QID) | ORAL | Status: DC | PRN
  Administered 2017-05-07 – 2017-05-08 (×6): 750 mg via ORAL
  Filled 2017-05-07 (×6): qty 1

## 2017-05-07 MED FILL — METHOCARBAMOL 750 MG TABLET: 750 | 15 days supply | Qty: 60 | Fill #0

## 2017-05-07 NOTE — Progress Notes (Signed)
RT went into pt's room to see if pt wanted to wear cpap for the night. Pt stated he did. RT made pt aware that when he was ready to be placed on cpap to let his nurse know and they will call me. RT will continue to monitor.

## 2017-05-07 NOTE — Anesthesia Postprocedure Evaluation (Signed)
Anesthesia Post Note  Patient: George Wise  Procedure(s) Performed: Microdiscectomy - Thoracic ten -Thoracic eleven  transpedicular (N/A Back)     Patient location during evaluation: PACU Anesthesia Type: General Level of consciousness: awake and alert Pain management: pain level controlled Vital Signs Assessment: post-procedure vital signs reviewed and stable Respiratory status: spontaneous breathing, nonlabored ventilation, respiratory function stable and patient connected to nasal cannula oxygen Cardiovascular status: blood pressure returned to baseline and stable Postop Assessment: no apparent nausea or vomiting Anesthetic complications: no    Last Vitals:  Vitals:   05/07/17 0000 05/07/17 0400  BP: (!) 144/94 116/76  Pulse: (!) 102 87  Resp: (!) 22 20  Temp: 37 C 37.4 C  SpO2: 97% 97%    Last Pain:  Vitals:   05/07/17 0539  TempSrc:   PainSc: 6                  Json Koelzer

## 2017-05-07 NOTE — Discharge Instructions (Signed)

## 2017-05-07 NOTE — Discharge Summary (Signed)
Physician Discharge Summary  Patient ID: George Wise MRN: [id number] DOB/AGE: 12-01-1970 46 y.o.  Admit date: 05/06/2017 Discharge date: 05/07/2017  Admission Diagnoses: Herniated nucleus pulposus T10-11  Discharge Diagnoses: Same Active Problems:   HNP (herniated nucleus pulposus), thoracic   Discharged Condition: good  Hospital Course: Patient admitted hospital underwent transpedicular discectomy at T10-T11 postoperative patient did fairly well had a little bit of an issue with pain management and pain control on postoperative day 1 however this will be worked on throughout the day and plan on probable discharge in the afternoon.  Consults:   Significant Diagnostic Studies:   Treatments: surgery: Transpedicular decompression T10-T11  Discharge Exam: Blood pressure 122/65, pulse (!) 117, temperature 98.7 F (37.1 C), resp. rate 18, height 6\' 2"  (1.88 m), weight 127.5 kg (281 lb), SpO2 94 %. Strength out of 5 wound clean dry and intact  Disposition: 01-Home or Self Care   Allergies as of 05/07/2017   No Known Allergies     Medication List    STOP taking these medications   ondansetron 4 MG disintegrating tablet Commonly known as:  ZOFRAN ODT   ondansetron 4 MG tablet Commonly known as:  ZOFRAN     TAKE these medications   atorvastatin 40 MG tablet Commonly known as:  LIPITOR TAKE 1 TABLET BY MOUTH DAILY. What changed:  See the new instructions.   doxycycline 100 MG capsule Commonly known as:  VIBRAMYCIN Take 1 capsule (100 mg total) by mouth 2 (two) times daily.   HYDROcodone-acetaminophen 5-325 MG tablet Commonly known as:  NORCO/VICODIN Take 1-2 tablets by mouth every 4 (four) hours as needed. What changed:  how much to take  reasons to take this   lisinopril 40 MG tablet Commonly known as:  PRINIVIL,ZESTRIL TAKE 1 TABLET BY MOUTH DAILY. What changed:  how much to take  how to take this  when to take this  additional instructions    metaxalone 800 MG tablet Commonly known as:  SKELAXIN Take 1 tablet (800 mg total) by mouth 3 (three) times daily as needed for muscle spasms.   metFORMIN 850 MG tablet Commonly known as:  GLUCOPHAGE Take 850 mg by mouth 2 (two) times daily with a meal.   naproxen sodium 220 MG tablet Commonly known as:  ANAPROX Take 440 mg by mouth daily as needed (PAIN).   NEXIUM 40 MG capsule Generic drug:  esomeprazole TAKE 1 CAPSULE BY MOUTH 2 TIMES DAILY BEFORE A MEAL   oxyCODONE-acetaminophen 5-325 MG tablet Commonly known as:  PERCOCET/ROXICET Take 1-2 tablets by mouth every 4 (four) hours as needed for moderate pain.   PARoxetine 10 MG tablet Commonly known as:  PAXIL TAKE 1 TABLET BY MOUTH EVERY MORNING. What changed:  See the new instructions.   traMADol 50 MG tablet Commonly known as:  ULTRAM Take 1 tablet (50 mg total) by mouth every 12 (twelve) hours as needed for severe pain.        Signed: Noor Witte P 05/07/2017, 10:01 AM

## 2017-05-07 NOTE — Progress Notes (Signed)
Pt placed self on CPAP

## 2017-05-08 ENCOUNTER — Inpatient Hospital Stay (HOSPITAL_COMMUNITY)
Admission: EM | Admit: 2017-05-08 | Discharge: 2017-05-11 | DRG: 520 | Disposition: A | Discharge status: Home / Self Care | Payer: 59 | Attending: Neurosurgery | Admitting: Neurosurgery

## 2017-05-08 ENCOUNTER — Encounter (HOSPITAL_COMMUNITY): Payer: Self-pay | Admitting: Emergency Medicine

## 2017-05-08 DIAGNOSIS — M5124 Other intervertebral disc displacement, thoracic region: Secondary | ICD-10-CM

## 2017-05-08 DIAGNOSIS — M5114 Intervertebral disc disorders with radiculopathy, thoracic region: Secondary | ICD-10-CM | POA: Diagnosis present

## 2017-05-08 DIAGNOSIS — Z23 Encounter for immunization: Secondary | ICD-10-CM

## 2017-05-08 DIAGNOSIS — M5104 Intervertebral disc disorders with myelopathy, thoracic region: Principal | ICD-10-CM | POA: Diagnosis present

## 2017-05-08 DIAGNOSIS — E119 Type 2 diabetes mellitus without complications: Secondary | ICD-10-CM | POA: Diagnosis present

## 2017-05-08 DIAGNOSIS — G8918 Other acute postprocedural pain: Secondary | ICD-10-CM | POA: Diagnosis present

## 2017-05-08 DIAGNOSIS — M549 Dorsalgia, unspecified: Secondary | ICD-10-CM | POA: Diagnosis present

## 2017-05-08 DIAGNOSIS — Z79899 Other long term (current) drug therapy: Secondary | ICD-10-CM

## 2017-05-08 LAB — GLUCOSE, CAPILLARY: GLUCOSE-CAPILLARY: 133 mg/dL — AB (ref 65–99)

## 2017-05-08 MED ORDER — PROMETHAZINE HCL 25 MG PO TABS
25.0000 mg | ORAL_TABLET | Freq: Once | ORAL | Status: AC
  Administered 2017-05-08: 25 mg via ORAL
  Filled 2017-05-08: qty 1

## 2017-05-08 MED FILL — OXYCODONE W/APAP 5/325 TAB: 5-325 | 8 days supply | Qty: 60 | Fill #0

## 2017-05-08 NOTE — ED Triage Notes (Signed)
Pt transported from home with severe back pain and nausea, pt d/c today from [facility] after T10-T11 discectomy. IV est by ems, Fentanyl 137mcg, 29mcg and 22mcg, Zofran 4mg  IV given by EMS.

## 2017-05-09 ENCOUNTER — Observation Stay (HOSPITAL_COMMUNITY): Payer: 59

## 2017-05-09 ENCOUNTER — Encounter (HOSPITAL_COMMUNITY): Payer: Self-pay | Admitting: Emergency Medicine

## 2017-05-09 DIAGNOSIS — M549 Dorsalgia, unspecified: Secondary | ICD-10-CM | POA: Diagnosis present

## 2017-05-09 DIAGNOSIS — M5114 Intervertebral disc disorders with radiculopathy, thoracic region: Secondary | ICD-10-CM | POA: Diagnosis present

## 2017-05-09 DIAGNOSIS — Z79899 Other long term (current) drug therapy: Secondary | ICD-10-CM | POA: Diagnosis not present

## 2017-05-09 DIAGNOSIS — Z23 Encounter for immunization: Secondary | ICD-10-CM | POA: Diagnosis not present

## 2017-05-09 DIAGNOSIS — G8918 Other acute postprocedural pain: Secondary | ICD-10-CM | POA: Diagnosis present

## 2017-05-09 DIAGNOSIS — R1111 Vomiting without nausea: Secondary | ICD-10-CM | POA: Diagnosis not present

## 2017-05-09 DIAGNOSIS — M5104 Intervertebral disc disorders with myelopathy, thoracic region: Secondary | ICD-10-CM | POA: Diagnosis present

## 2017-05-09 DIAGNOSIS — E119 Type 2 diabetes mellitus without complications: Secondary | ICD-10-CM | POA: Diagnosis present

## 2017-05-09 LAB — COMPREHENSIVE METABOLIC PANEL
ALBUMIN: 3.4 g/dL — AB (ref 3.5–5.0)
ALK PHOS: 69 U/L (ref 38–126)
ALT: 21 U/L (ref 17–63)
ANION GAP: 8 (ref 5–15)
AST: 27 U/L (ref 15–41)
BUN: 9 mg/dL (ref 6–20)
CALCIUM: 8.6 mg/dL — AB (ref 8.9–10.3)
CO2: 25 mmol/L (ref 22–32)
Chloride: 101 mmol/L (ref 101–111)
Creatinine, Ser: 1.07 mg/dL (ref 0.61–1.24)
GFR calc Af Amer: >60 mL/min (ref >60)
GFR calc non Af Amer: >60 mL/min (ref >60)
GLUCOSE: 170 mg/dL — AB (ref 65–99)
Potassium: 3.4 mmol/L — ABNORMAL LOW (ref 3.5–5.1)
SODIUM: 134 mmol/L — AB (ref 135–145)
Total Bilirubin: 1 mg/dL (ref 0.3–1.2)
Total Protein: 6.1 g/dL — ABNORMAL LOW (ref 6.5–8.1)

## 2017-05-09 LAB — I-STAT CHEM 8, ED
BUN: 11 mg/dL (ref 6–20)
Calcium, Ion: 1.08 mmol/L — ABNORMAL LOW (ref 1.15–1.40)
Chloride: 101 mmol/L (ref 101–111)
Creatinine, Ser: 1 mg/dL (ref 0.61–1.24)
GLUCOSE: 169 mg/dL — AB (ref 65–99)
HEMATOCRIT: 31 % — AB (ref 39.0–52.0)
HEMOGLOBIN: 10.5 g/dL — AB (ref 13.0–17.0)
Potassium: 3.4 mmol/L — ABNORMAL LOW (ref 3.5–5.1)
SODIUM: 137 mmol/L (ref 135–145)
TCO2: 25 mmol/L (ref 22–32)

## 2017-05-09 LAB — GLUCOSE, CAPILLARY
GLUCOSE-CAPILLARY: 153 mg/dL — AB (ref 65–99)
GLUCOSE-CAPILLARY: 215 mg/dL — AB (ref 65–99)
GLUCOSE-CAPILLARY: 212 mg/dL — AB (ref 65–99)
Glucose-Capillary: 134 mg/dL — ABNORMAL HIGH (ref 65–99)
Glucose-Capillary: 134 mg/dL — ABNORMAL HIGH (ref 65–99)
Glucose-Capillary: 154 mg/dL — ABNORMAL HIGH (ref 65–99)

## 2017-05-09 LAB — CBC WITH DIFFERENTIAL/PLATELET
BASOS PCT: 0 %
Basophils Absolute: 0 10*3/uL (ref 0.0–0.1)
EOS ABS: 0.1 10*3/uL (ref 0.0–0.7)
Eosinophils Relative: 1 %
HEMATOCRIT: 33.8 % — AB (ref 39.0–52.0)
Hemoglobin: 11.7 g/dL — ABNORMAL LOW (ref 13.0–17.0)
Lymphocytes Relative: 21 %
Lymphs Abs: 1.6 10*3/uL (ref 0.7–4.0)
MCH: 31.9 pg (ref 26.0–34.0)
MCHC: 34.6 g/dL (ref 30.0–36.0)
MCV: 92.1 fL (ref 78.0–100.0)
MONO ABS: 0.9 10*3/uL (ref 0.1–1.0)
MONOS PCT: 12 %
Neutro Abs: 5 10*3/uL (ref 1.7–7.7)
Neutrophils Relative %: 66 %
Platelets: 127 10*3/uL — ABNORMAL LOW (ref 150–400)
RBC: 3.67 MIL/uL — ABNORMAL LOW (ref 4.22–5.81)
RDW: 12.3 % (ref 11.5–15.5)
WBC: 7.6 10*3/uL (ref 4.0–10.5)

## 2017-05-09 LAB — URINALYSIS, ROUTINE W REFLEX MICROSCOPIC
Bilirubin Urine: NEGATIVE
Glucose, UA: NEGATIVE mg/dL
HGB URINE DIPSTICK: NEGATIVE
Ketones, ur: NEGATIVE mg/dL
Leukocytes, UA: NEGATIVE
NITRITE: NEGATIVE
PROTEIN: NEGATIVE mg/dL
SPECIFIC GRAVITY, URINE: 1.017 (ref 1.005–1.030)
pH: 8 (ref 5.0–8.0)

## 2017-05-09 LAB — HEMOGLOBIN A1C
HEMOGLOBIN A1C: 6.5 % — AB (ref 4.8–5.6)
Mean Plasma Glucose: 139.85 mg/dL

## 2017-05-09 MED ORDER — SODIUM CHLORIDE 0.9 % IV SOLN
250.0000 mL | INTRAVENOUS | Status: DC
  Administered 2017-05-09: 250 mL via INTRAVENOUS

## 2017-05-09 MED ORDER — NAPROXEN 250 MG PO TABS: 375.0000 mg | ORAL_TABLET | Freq: Every day | ORAL | Status: DC | PRN

## 2017-05-09 MED ORDER — PAROXETINE HCL 10 MG PO TABS
10.0000 mg | ORAL_TABLET | Freq: Every morning | ORAL | Status: DC
  Administered 2017-05-09 – 2017-05-11 (×3): 10 mg via ORAL
  Filled 2017-05-09 (×3): qty 1

## 2017-05-09 MED ORDER — BISACODYL 10 MG RE SUPP
10.0000 mg | Freq: Every day | RECTAL | Status: DC | PRN
Start: 2017-05-09 — End: 2017-05-11
  Administered 2017-05-10: 10 mg via RECTAL
  Filled 2017-05-09: qty 1

## 2017-05-09 MED ORDER — DOCUSATE SODIUM 100 MG PO CAPS
100.0000 mg | ORAL_CAPSULE | Freq: Two times a day (BID) | ORAL | Status: DC
  Administered 2017-05-09 – 2017-05-11 (×6): 100 mg via ORAL
  Filled 2017-05-09 (×6): qty 1

## 2017-05-09 MED ORDER — METHOCARBAMOL 1000 MG/10ML IJ SOLN: 1000.0000 mg | Freq: Once | INTRAMUSCULAR | Status: DC

## 2017-05-09 MED ORDER — PNEUMOCOCCAL VAC POLYVALENT 25 MCG/0.5ML IJ INJ
0.5000 mL | INJECTION | INTRAMUSCULAR | Status: AC
  Administered 2017-05-10: 0.5 mL via INTRAMUSCULAR
  Filled 2017-05-09: qty 0.5

## 2017-05-09 MED ORDER — PANTOPRAZOLE SODIUM 40 MG PO TBEC
80.0000 mg | DELAYED_RELEASE_TABLET | Freq: Every day | ORAL | Status: DC
  Administered 2017-05-09 – 2017-05-11 (×3): 80 mg via ORAL
  Filled 2017-05-09 (×3): qty 2

## 2017-05-09 MED ORDER — ONDANSETRON HCL 4 MG/2ML IJ SOLN
4.0000 mg | Freq: Four times a day (QID) | INTRAMUSCULAR | Status: DC | PRN
  Administered 2017-05-09: 4 mg via INTRAVENOUS

## 2017-05-09 MED ORDER — ACETAMINOPHEN 650 MG RE SUPP
650.0000 mg | RECTAL | Status: DC | PRN
Start: 2017-05-09 — End: 2017-05-11

## 2017-05-09 MED ORDER — LISINOPRIL 20 MG PO TABS
40.0000 mg | ORAL_TABLET | Freq: Every evening | ORAL | Status: DC
  Administered 2017-05-09 – 2017-05-10 (×2): 40 mg via ORAL
  Filled 2017-05-09 (×2): qty 2

## 2017-05-09 MED ORDER — INSULIN ASPART 100 UNIT/ML ~~LOC~~ SOLN
0.0000 [IU] | SUBCUTANEOUS | Status: DC
  Administered 2017-05-09 (×2): 7 [IU] via SUBCUTANEOUS
  Administered 2017-05-09 (×2): 4 [IU] via SUBCUTANEOUS
  Administered 2017-05-09: 3 [IU] via SUBCUTANEOUS
  Administered 2017-05-10: 7 [IU] via SUBCUTANEOUS
  Administered 2017-05-10: 4 [IU] via SUBCUTANEOUS
  Administered 2017-05-10 (×2): 7 [IU] via SUBCUTANEOUS
  Administered 2017-05-10: 3 [IU] via SUBCUTANEOUS
  Administered 2017-05-10 – 2017-05-11 (×2): 4 [IU] via SUBCUTANEOUS
  Administered 2017-05-11 (×2): 3 [IU] via SUBCUTANEOUS

## 2017-05-09 MED ORDER — OXYCODONE HCL 5 MG PO TABS: 5.0000 mg | ORAL_TABLET | ORAL | Status: DC | PRN

## 2017-05-09 MED ORDER — ACETAMINOPHEN 325 MG PO TABS: 650.0000 mg | ORAL_TABLET | ORAL | Status: DC | PRN

## 2017-05-09 MED ORDER — SODIUM CHLORIDE 0.9 % IV BOLUS (SEPSIS)
500.0000 mL | Freq: Once | INTRAVENOUS | Status: AC
  Administered 2017-05-09: 1000 mL via INTRAVENOUS

## 2017-05-09 MED ORDER — GABAPENTIN 600 MG PO TABS
300.0000 mg | ORAL_TABLET | Freq: Three times a day (TID) | ORAL | Status: DC
  Administered 2017-05-09 – 2017-05-11 (×7): 300 mg via ORAL
  Filled 2017-05-09 (×7): qty 1

## 2017-05-09 MED ORDER — SODIUM CHLORIDE 0.9% FLUSH
3.0000 mL | Freq: Two times a day (BID) | INTRAVENOUS | Status: DC
  Administered 2017-05-09 – 2017-05-11 (×5): 3 mL via INTRAVENOUS

## 2017-05-09 MED ORDER — METHOCARBAMOL 1000 MG/10ML IJ SOLN
1000.0000 mg | Freq: Once | INTRAVENOUS | Status: AC
  Administered 2017-05-09: 1000 mg via INTRAVENOUS
  Filled 2017-05-09: qty 10

## 2017-05-09 MED ORDER — MORPHINE SULFATE (PF) 4 MG/ML IV SOLN
4.0000 mg | INTRAVENOUS | Status: DC | PRN
  Administered 2017-05-09 (×2): 4 mg via INTRAVENOUS
  Filled 2017-05-09 (×2): qty 1

## 2017-05-09 MED ORDER — DEXAMETHASONE SODIUM PHOSPHATE 4 MG/ML IJ SOLN
4.0000 mg | Freq: Four times a day (QID) | INTRAMUSCULAR | Status: AC
  Administered 2017-05-09 – 2017-05-10 (×5): 4 mg via INTRAVENOUS
  Filled 2017-05-09 (×5): qty 1

## 2017-05-09 MED ORDER — PHENOL 1.4 % MT LIQD: 1.0000 | OROMUCOSAL | Status: DC | PRN

## 2017-05-09 MED ORDER — METFORMIN HCL 850 MG PO TABS
850.0000 mg | ORAL_TABLET | Freq: Two times a day (BID) | ORAL | Status: DC
  Administered 2017-05-09 – 2017-05-11 (×5): 850 mg via ORAL
  Filled 2017-05-09 (×6): qty 1

## 2017-05-09 MED ORDER — CYCLOBENZAPRINE HCL 10 MG PO TABS
10.0000 mg | ORAL_TABLET | Freq: Three times a day (TID) | ORAL | Status: DC | PRN
  Administered 2017-05-09 – 2017-05-10 (×4): 10 mg via ORAL
  Filled 2017-05-09 (×4): qty 1

## 2017-05-09 MED ORDER — MENTHOL 3 MG MT LOZG: 1.0000 | LOZENGE | OROMUCOSAL | Status: DC | PRN

## 2017-05-09 MED ORDER — SODIUM CHLORIDE 0.9% FLUSH: 3.0000 mL | INTRAVENOUS | Status: DC | PRN

## 2017-05-09 MED ORDER — OXYCODONE HCL ER 15 MG PO T12A
15.0000 mg | EXTENDED_RELEASE_TABLET | Freq: Two times a day (BID) | ORAL | Status: DC
  Administered 2017-05-09 – 2017-05-11 (×5): 15 mg via ORAL
  Filled 2017-05-09 (×5): qty 1

## 2017-05-09 MED ORDER — OXYCODONE HCL 5 MG PO TABS
10.0000 mg | ORAL_TABLET | ORAL | Status: DC | PRN
  Administered 2017-05-09 – 2017-05-11 (×6): 10 mg via ORAL
  Filled 2017-05-09 (×6): qty 2

## 2017-05-09 MED ORDER — ONDANSETRON HCL 4 MG PO TABS
4.0000 mg | ORAL_TABLET | Freq: Four times a day (QID) | ORAL | Status: DC | PRN
  Administered 2017-05-11: 4 mg via ORAL
  Filled 2017-05-09: qty 1

## 2017-05-09 MED ORDER — ATORVASTATIN CALCIUM 40 MG PO TABS
40.0000 mg | ORAL_TABLET | Freq: Every evening | ORAL | Status: DC
  Administered 2017-05-09 – 2017-05-10 (×2): 40 mg via ORAL
  Filled 2017-05-09 (×2): qty 1

## 2017-05-09 MED ORDER — HYDROMORPHONE HCL 1 MG/ML IJ SOLN
1.0000 mg | Freq: Once | INTRAMUSCULAR | Status: AC
  Administered 2017-05-09: 1 mg via INTRAVENOUS
  Filled 2017-05-09: qty 1

## 2017-05-09 NOTE — Progress Notes (Signed)
Pt states he has not had a bowel movement since 05/05/17. RN notified pt that he can have Dulcolax for moderate constipation. Pt states he would rather have an oral laxative first. RN notified MD. No new orders currently.

## 2017-05-09 NOTE — ED Notes (Signed)
Pt to CT via stretcher

## 2017-05-09 NOTE — Evaluation (Signed)
Physical Therapy Evaluation Patient Details Name: George Wise MRN: [id number] DOB: May 19, 1971 Today's Date: 05/09/2017   History of Present Illness  Patient had a thoracic micordisectomy on 05/06/2017. He went home and began to have intractable lower back pain at home. He was admited with intractable pain.   Clinical Impression  Patient was limited by sharp spasming today. He reports it has improved significantly since admission. He ambulated 25'. He reported feeling better sitting up. He was advised to continue walking in the room but walk with nursing and don't over-do it. Acute therapy will continue to follow him for gait and endurance training. He felt safest with the rolling walker today.     Follow Up Recommendations No PT follow up    Equipment Recommendations  Rolling walker with 5" wheels    Recommendations for Other Services       Precautions / Restrictions Precautions Precautions: Back Restrictions Weight Bearing Restrictions: No      Mobility  Bed Mobility Overal bed mobility: Needs Assistance Bed Mobility: Supine to Sit     Supine to sit: Min assist     General bed mobility comments: Min a to sit up in bed   Transfers Overall transfer level: Needs assistance Equipment used: Rolling walker (2 wheeled) Transfers: Sit to/from Stand Sit to Stand: Min guard         General transfer comment: cuing for proper use of the walker. Patient reported feeling safer transfering with the walker. Attempted at first without the walker.   Ambulation/Gait Ambulation/Gait assistance: Min guard   Assistive device: Rolling walker (2 wheeled) Gait Pattern/deviations: Step-through pattern     General Gait Details: slow with slight trunk flexion. Patient advised to stand straight if able. Pain comes in sharp spells but is not consistent   Financial trader Rankin (Stroke Patients Only)       Balance Overall balance  assessment: Needs assistance Sitting-balance support: Single extremity supported Sitting balance-Leahy Scale: Fair Sitting balance - Comments: needs to support his right hand on the chair    Standing balance support: Bilateral upper extremity supported Standing balance-Leahy Scale: Poor Standing balance comment: uses the walker for balance                              Pertinent Vitals/Pain Pain Location: low back into right buttock  Pain Descriptors / Indicators: Aching    Home Living Family/patient expects to be discharged to:: Private residence Living Arrangements: Spouse/significant other   Type of Home: House                Prior Function Level of Independence: Independent               Hand Dominance   Dominant Hand: Right    Extremity/Trunk Assessment   Upper Extremity Assessment Upper Extremity Assessment: Overall WFL for tasks assessed    Lower Extremity Assessment Lower Extremity Assessment: Overall WFL for tasks assessed       Communication   Communication: No difficulties  Cognition Arousal/Alertness: Awake/alert Behavior During Therapy: WFL for tasks assessed/performed Overall Cognitive Status: Within Functional Limits for tasks assessed                                        General  Comments General comments (skin integrity, edema, etc.): leans on right arm in sitting; MNo significant spasming noted with palpation of the lumbar spine and hip     Exercises     Assessment/Plan    PT Assessment Patient needs continued PT services  PT Problem List Decreased strength;Decreased range of motion;Decreased activity tolerance;Decreased mobility;Decreased safety awareness;Pain       PT Treatment Interventions Gait training;Stair training;Functional mobility training;Therapeutic activities;Therapeutic exercise;Neuromuscular re-education;Patient/family education    PT Goals (Current goals can be found in the Care  Plan section)  Acute Rehab PT Goals Patient Stated Goal: to have less pain  PT Goal Formulation: With patient/family Time For Goal Achievement: 05/23/17 Potential to Achieve Goals: Good    Frequency Min 5X/week   Barriers to discharge        Co-evaluation               AM-PAC PT "6 Clicks" Daily Activity  Outcome Measure Difficulty turning over in bed (including adjusting bedclothes, sheets and blankets)?: A Little Difficulty moving from lying on back to sitting on the side of the bed? : A Little Difficulty sitting down on and standing up from a chair with arms (e.g., wheelchair, bedside commode, etc,.)?: A Little Help needed moving to and from a bed to chair (including a wheelchair)?: A Little Help needed walking in hospital room?: A Little Help needed climbing 3-5 steps with a railing? : A Little 6 Click Score: 18    End of Session Equipment Utilized During Treatment: Gait belt Activity Tolerance: Patient limited by pain Patient left: in chair;with call bell/phone within reach;with family/visitor present Nurse Communication: Mobility status PT Visit Diagnosis: Unsteadiness on feet (R26.81);Pain;Difficulty in walking, not elsewhere classified (R26.2) Pain - Right/Left: Right Pain - part of body: Hip    Time: 1200-1218 PT Time Calculation (min) (ACUTE ONLY): 18 min   Charges:   PT Evaluation $PT Eval Low Complexity: 1 Low     PT G Codes:       Carney Living PT DPT  05/09/2017, 5:05 PM

## 2017-05-09 NOTE — H&P (Signed)
Subjective:  The patient is a 46 year old white male on whom Dr. Saintclair Halsted performed a left T10-11 discectomy 3 days ago. The patient was discharged yesterday. By the time he got home he had severe pain. He came to the [facility] ER with intractable pain. He was admitted for pain control.  He complains of pain in his right buttocks revealed on his right leg. Interestingly, his surgery was on the left. He's had no fever or drainage from his wound.  Objective: Vital signs in last 24 hours: Temp:  [98.9 F (37.2 C)-99.3 F (37.4 C)] 98.9 F (37.2 C) (10/27 0504) Pulse Rate:  [74-101] 74 (10/27 0504) Resp:  [16-29] 29 (10/27 0100) BP: (123-142)/(54-80) 123/54 (10/27 0504) SpO2:  [95 %-98 %] 97 % (10/27 0504) Weight:  [127.9 kg (282 lb)] 127.9 kg (282 lb) (10/26 2327)  Intake/Output from previous day: 10/26 0701 - 10/27 0700 In: 500 [I.V.:500] Out: 700 [Urine:700] Intake/Output this shift: No intake/output data recorded.  Physical exam the patient is alert and pleasant. He is in no obvious distress. His dressing is clean and dry. There is no signs of infection. His strength is grossly normal his bilateral lower extremities. There is no swelling.  Lab Results:  Recent Labs  05/09/17 0007 05/09/17 0043  WBC 7.6  --   HGB 11.7* 10.5*  HCT 33.8* 31.0*  PLT 127*  --    BMET  Recent Labs  05/08/17 2351 05/09/17 0043  NA 134* 137  K 3.4* 3.4*  CL 101 101  CO2 25  --   GLUCOSE 170* 169*  BUN 9 11  CREATININE 1.07 1.00  CALCIUM 8.6*  --     Studies/Results: Ct Abdomen Pelvis Wo Contrast  Result Date: 05/09/2017 CLINICAL DATA:  Acute onset of nausea and vomiting. Recent lower thoracic discectomy. Initial encounter. EXAM: CT ABDOMEN AND PELVIS WITHOUT CONTRAST TECHNIQUE: Multidetector CT imaging of the abdomen and pelvis was performed following the standard protocol without IV contrast. COMPARISON:  MRI of the lumbar spine performed 02/24/2017, and CT of the abdomen and pelvis  performed 04/24/2016 FINDINGS: Lower chest: Mild bibasilar scarring is noted. The visualized portions of the mediastinum are unremarkable. Hepatobiliary: The liver is unremarkable in appearance. The gallbladder is unremarkable in appearance. The common bile duct remains normal in caliber. Pancreas: The pancreas is within normal limits. Spleen: The spleen is unremarkable in appearance. Adrenals/Urinary Tract: The adrenal glands are unremarkable in appearance. 3 mm nonobstructing stone is noted at the interpole region of the right kidney. The kidneys are otherwise grossly unremarkable. Nonspecific perinephric stranding is noted bilaterally. There is no evidence of hydronephrosis. No obstructing ureteral stones are identified. Stomach/Bowel: The stomach is unremarkable in appearance. The small bowel is within normal limits. The appendix is not visualized; there is no evidence for appendicitis. The colon is unremarkable in appearance. Vascular/Lymphatic: Postoperative change is noted about the mid and distal abdominal aorta. Overlying mesenteric stranding is similar to 2017 and likely postoperative in nature. The vasculature is not well assessed without contrast. The inferior vena cava is grossly unremarkable. No significant retroperitoneal or pelvic sidewall lymphadenopathy is seen. Reproductive: The bladder is mildly distended and grossly unremarkable. The prostate remains normal in size. Other: No additional soft tissue abnormalities are seen. Musculoskeletal: No acute osseous abnormalities are identified. The visualized musculature is unremarkable in appearance. IMPRESSION: 1. No acute abnormality seen within the abdomen or pelvis. 2. 3 mm nonobstructing stone at the interpole region of the right kidney. 3. Postoperative change again noted  along the mid and distal abdominal aorta. No definite evidence for recurrent mass. 4. Mild bibasilar scarring noted.  Lung bases otherwise clear. Electronically Signed   By:  Garald Balding M.D.   On: 05/09/2017 01:44    Assessment/Plan: Intractable back pain: I will add low dose Decadron for inflammation, as the patient is diabetic. I'll add Neurontin as well. I don't think he needs an MR presently as his strength and sensation is normal.  LOS: 0 days     Mishal Probert D 05/09/2017, 8:14 AM

## 2017-05-09 NOTE — ED Notes (Signed)
Pt assist onto bed pain for attempted BM

## 2017-05-09 NOTE — ED Notes (Signed)
Pt states he was unable to have BM

## 2017-05-09 NOTE — ED Provider Notes (Signed)
[facility] EMERGENCY DEPARTMENT Provider Note   CSN: [id number] Arrival date & time: 05/08/17  2320     History   Chief Complaint Chief Complaint  Patient presents with  . Back Pain    HPI George Wise is a 46 y.o. male.  The history is provided by the patient.  Back Pain   This is a recurrent problem. The current episode started 6 to 12 hours ago. The problem occurs constantly. The problem has not changed since onset.The pain is associated with no known injury. The pain is present in the lumbar spine and sacro-iliac joint. The quality of the pain is described as stabbing. The pain does not radiate. The pain is at a severity of 10/10. The pain is severe. The pain is the same all the time. Pertinent negatives include no chest pain, no fever, no numbness, no weight loss, no headaches, no abdominal pain, no abdominal swelling, no bowel incontinence, no perianal numbness, no bladder incontinence, no dysuria, no pelvic pain, no leg pain, no paresthesias, no paresis, no tingling and no weakness. He has tried analgesics for the symptoms. The treatment provided no relief. Risk factors: post operative.    Past Medical History:  Diagnosis Date  . Arthritis   . Diabetes mellitus without complication ([place])   . GERD (gastroesophageal reflux disease)   . Glucose intolerance (impaired glucose tolerance)   . Hyperlipidemia   . Hypertension   . Obesity   . OSA (obstructive sleep apnea) 02/17/2011  . PONV (postoperative nausea and vomiting)   . Sleep apnea   . Testicular cancer [facility] 2001    Patient Active Problem List   Diagnosis Date Noted  . Acute post-operative pain 05/09/2017  . HNP (herniated nucleus pulposus), thoracic 05/06/2017  . Obesity 02/02/2012  . OSA (obstructive sleep apnea) 02/17/2011  . Encounter for examination for driving license 41/66/0630  . HYPERCHOLESTEROLEMIA 03/23/2009  . HYPERTENSION, BENIGN 03/23/2009    Past Surgical History:    Procedure Laterality Date  . HERNIA REPAIR    . LUMBAR LAMINECTOMY/DECOMPRESSION MICRODISCECTOMY N/A 05/06/2017   Procedure: Microdiscectomy - Thoracic ten -Thoracic eleven  transpedicular;  Surgeon: Kary Kos, MD;  Location: [facility];  Service: Neurosurgery;  Laterality: N/A;  . lymph node removal  2001   from adb. -20  . PLANTAR FASCIA RELEASE  09/2013  . TENDON RELEASE  05/2011   left foot  . TESTICLE SURGERY  2001   resection for cancer left       Home Medications    Prior to Admission medications   Medication Sig Start Date End Date Taking? Authorizing Provider  atorvastatin (LIPITOR) 40 MG tablet TAKE 1 TABLET BY MOUTH DAILY. Patient taking differently: TAKE 1 TABLET BY MOUTH IN THE EVENING 11/30/12   Bensimhon, Shaune Pascal, MD  doxycycline (VIBRAMYCIN) 100 MG capsule Take 1 capsule (100 mg total) by mouth 2 (two) times daily. Patient not taking: Reported on 04/24/2016 09/26/14   Delos Haring, PA-C  HYDROcodone-acetaminophen (NORCO/VICODIN) 5-325 MG tablet Take 1-2 tablets by mouth every 4 (four) hours as needed. Patient taking differently: Take 1 tablet by mouth every 4 (four) hours as needed for severe pain.  04/24/16   Davonna Belling, MD  lisinopril (PRINIVIL,ZESTRIL) 40 MG tablet TAKE 1 TABLET BY MOUTH DAILY. Patient taking differently: Take 40 mg by mouth every evening.  12/03/12   Bensimhon, Shaune Pascal, MD  metaxalone (SKELAXIN) 800 MG tablet Take 1 tablet (800 mg total) by mouth 3 (three) times daily as  needed for muscle spasms. 04/24/16   Davonna Belling, MD  metFORMIN (GLUCOPHAGE) 850 MG tablet Take 850 mg by mouth 2 (two) times daily with a meal.    [provider]  methocarbamol (ROBAXIN) 750 MG tablet Take 1 tablet (750 mg total) by mouth every 6 (six) hours as needed for muscle spasms. 05/07/17   Kary Kos, MD  naproxen sodium (ANAPROX) 220 MG tablet Take 440 mg by mouth daily as needed (PAIN).    [provider]  NEXIUM 40 MG capsule TAKE 1  CAPSULE BY MOUTH 2 TIMES DAILY BEFORE A MEAL 09/19/13   Bensimhon, Shaune Pascal, MD  oxyCODONE-acetaminophen (PERCOCET/ROXICET) 5-325 MG tablet Take 1-2 tablets by mouth every 4 (four) hours as needed for moderate pain. 05/07/17   Kary Kos, MD  PARoxetine (PAXIL) 10 MG tablet TAKE 1 TABLET BY MOUTH EVERY MORNING. Patient taking differently: TAKE 1 TABLET BY MOUTH EVERY IN THE EVENING 12/10/12   Bensimhon, Shaune Pascal, MD  traMADol (ULTRAM) 50 MG tablet Take 1 tablet (50 mg total) by mouth every 12 (twelve) hours as needed for severe pain. Patient not taking: Reported on 04/28/2017 04/08/15   Everlene Balls, MD    Family History Family History  Problem Relation Age of Onset  . Diabetes Paternal Grandfather   . Stroke Paternal Grandfather   . Ovarian cancer Paternal Grandmother   . Stomach cancer Paternal Grandmother     Social History Social History  Substance Use Topics  . Smoking status: Never Smoker  . Smokeless tobacco: Never Used  . Alcohol use No     Allergies   Patient has no known allergies.   Review of Systems Review of Systems  Constitutional: Negative for diaphoresis, fever and weight loss.  Respiratory: Negative for shortness of breath.   Cardiovascular: Negative for chest pain, palpitations and leg swelling.  Gastrointestinal: Negative for abdominal pain and bowel incontinence.  Genitourinary: Negative for bladder incontinence, difficulty urinating, dysuria and pelvic pain.  Musculoskeletal: Positive for back pain.  Neurological: Negative for tingling, weakness, numbness, headaches and paresthesias.  All other systems reviewed and are negative.    Physical Exam Updated Vital Signs BP 132/72   Pulse 96   Temp 99.3 F (37.4 C) (Oral)   Resp 20   Ht 6\' 2"  (1.88 m)   Wt 127.9 kg (282 lb)   SpO2 95%   BMI 36.21 kg/m   Physical Exam  Constitutional: He is oriented to person, place, and time. He appears well-developed and well-nourished. No distress.  HENT:  Head:  Normocephalic and atraumatic.  Mouth/Throat: Oropharynx is clear and moist. No oropharyngeal exudate.  Eyes: Pupils are equal, round, and reactive to light. Conjunctivae are normal.  Neck: Normal range of motion. Neck supple. No JVD present.  Cardiovascular: Normal rate, regular rhythm, normal heart sounds and intact distal pulses.   Pulmonary/Chest: Effort normal and breath sounds normal. He has no wheezes. He has no rales.  Abdominal: Soft. Bowel sounds are normal. He exhibits no mass. There is no tenderness. There is no rebound and no guarding.  Musculoskeletal: He exhibits no edema.  Neurological: He is alert and oriented to person, place, and time. He displays normal reflexes.  Skin: Skin is warm and dry. Capillary refill takes less than 2 seconds.  Psychiatric: He has a normal mood and affect.     ED Treatments / Results  Labs (all labs ordered are listed, but only abnormal results are displayed) Labs Reviewed  COMPREHENSIVE METABOLIC PANEL  CBC WITH  DIFFERENTIAL/PLATELET  URINALYSIS, ROUTINE W REFLEX MICROSCOPIC  I-STAT CHEM 8, ED    EKG  EKG Interpretation  Date/Time:  Saturday May 09 2017 00:08:13 EDT Ventricular Rate:  93 PR Interval:    QRS Duration: 101 QT Interval:  314 QTC Calculation: 391 R Axis:   83 Text Interpretation:  Sinus rhythm Borderline prolonged PR interval Confirmed by Randal Buba, Gerri Acre ([id number]) on 05/09/2017 12:21:43 AM        Procedures Procedures (including critical care time)  Medications Ordered in ED Medications  sodium chloride 0.9 % bolus 500 mL (1,000 mLs Intravenous New Bag/Given 05/09/17 0037)  HYDROmorphone (DILAUDID) injection 1 mg (1 mg Intravenous Given 05/09/17 0038)   Case d/w Dr. Wynonia Sours please admit to the 5th floor.     Final Clinical Impressions(s) / ED Diagnoses   Final diagnoses:  Acute post-operative pain  Will admit to observation 5th floor for pain management.    New Prescriptions New Prescriptions   No  medications on file     Uthman Mroczkowski, MD 05/09/17 [phone]

## 2017-05-10 LAB — GLUCOSE, CAPILLARY
GLUCOSE-CAPILLARY: 208 mg/dL — AB (ref 65–99)
GLUCOSE-CAPILLARY: 209 mg/dL — AB (ref 65–99)
GLUCOSE-CAPILLARY: 214 mg/dL — AB (ref 65–99)
GLUCOSE-CAPILLARY: 199 mg/dL — AB (ref 65–99)
GLUCOSE-CAPILLARY: 131 mg/dL — AB (ref 65–99)
Glucose-Capillary: 168 mg/dL — ABNORMAL HIGH (ref 65–99)

## 2017-05-10 NOTE — Progress Notes (Signed)
Patient ID: George Wise, male   DOB: Apr 28, 1971, 46 y.o.   MRN: [id number] Subjective:  The patient is alert and pleasant. His wife is at the bedside. He is ambulating. He feels a bit better today. He complains of diffuse back pain and bilateral lower abdominal pain with aching in his legs.  Objective: Vital signs in last 24 hours: Temp:  [98.3 F (36.8 C)-98.9 F (37.2 C)] 98.3 F (36.8 C) (10/28 0500) Pulse Rate:  [52-82] 52 (10/28 0500) Resp:  [20] 20 (10/28 0500) BP: (123-128)/(70-90) 128/76 (10/28 0500) SpO2:  [95 %-99 %] 99 % (10/28 0500)  Intake/Output from previous day: 10/27 0701 - 10/28 0700 In: 732.4 [P.O.:720; I.V.:12.4] Out: 900 [Urine:900] Intake/Output this shift: No intake/output data recorded.  Physical exam the patient is alert and pleasant. History is grossly normal in his lower extremities.  Lab Results:  Recent Labs  05/09/17 0007 05/09/17 0043  WBC 7.6  --   HGB 11.7* 10.5*  HCT 33.8* 31.0*  PLT 127*  --    BMET  Recent Labs  05/08/17 2351 05/09/17 0043  NA 134* 137  K 3.4* 3.4*  CL 101 101  CO2 25  --   GLUCOSE 170* 169*  BUN 9 11  CREATININE 1.07 1.00  CALCIUM 8.6*  --     Studies/Results: Ct Abdomen Pelvis Wo Contrast  Result Date: 05/09/2017 CLINICAL DATA:  Acute onset of nausea and vomiting. Recent lower thoracic discectomy. Initial encounter. EXAM: CT ABDOMEN AND PELVIS WITHOUT CONTRAST TECHNIQUE: Multidetector CT imaging of the abdomen and pelvis was performed following the standard protocol without IV contrast. COMPARISON:  MRI of the lumbar spine performed 02/24/2017, and CT of the abdomen and pelvis performed 04/24/2016 FINDINGS: Lower chest: Mild bibasilar scarring is noted. The visualized portions of the mediastinum are unremarkable. Hepatobiliary: The liver is unremarkable in appearance. The gallbladder is unremarkable in appearance. The common bile duct remains normal in caliber. Pancreas: The pancreas is within normal  limits. Spleen: The spleen is unremarkable in appearance. Adrenals/Urinary Tract: The adrenal glands are unremarkable in appearance. 3 mm nonobstructing stone is noted at the interpole region of the right kidney. The kidneys are otherwise grossly unremarkable. Nonspecific perinephric stranding is noted bilaterally. There is no evidence of hydronephrosis. No obstructing ureteral stones are identified. Stomach/Bowel: The stomach is unremarkable in appearance. The small bowel is within normal limits. The appendix is not visualized; there is no evidence for appendicitis. The colon is unremarkable in appearance. Vascular/Lymphatic: Postoperative change is noted about the mid and distal abdominal aorta. Overlying mesenteric stranding is similar to 2017 and likely postoperative in nature. The vasculature is not well assessed without contrast. The inferior vena cava is grossly unremarkable. No significant retroperitoneal or pelvic sidewall lymphadenopathy is seen. Reproductive: The bladder is mildly distended and grossly unremarkable. The prostate remains normal in size. Other: No additional soft tissue abnormalities are seen. Musculoskeletal: No acute osseous abnormalities are identified. The visualized musculature is unremarkable in appearance. IMPRESSION: 1. No acute abnormality seen within the abdomen or pelvis. 2. 3 mm nonobstructing stone at the interpole region of the right kidney. 3. Postoperative change again noted along the mid and distal abdominal aorta. No definite evidence for recurrent mass. 4. Mild bibasilar scarring noted.  Lung bases otherwise clear. Electronically Signed   By: Garald Balding M.D.   On: 05/09/2017 01:44    Assessment/Plan: Postop day #4: The patient has improved on steroids and Neurontin. Perhaps he can go home tomorrow. I answered the  patient's, and his wife's, questions.  LOS: 1 day     Etter Royall D 05/10/2017, 12:33 PM

## 2017-05-11 LAB — GLUCOSE, CAPILLARY
GLUCOSE-CAPILLARY: 112 mg/dL — AB (ref 65–99)
GLUCOSE-CAPILLARY: 124 mg/dL — AB (ref 65–99)
GLUCOSE-CAPILLARY: 141 mg/dL — AB (ref 65–99)
Glucose-Capillary: 161 mg/dL — ABNORMAL HIGH (ref 65–99)

## 2017-05-11 NOTE — Discharge Instructions (Signed)
Continue instructions from previous discharge

## 2017-05-11 NOTE — Progress Notes (Signed)
Patient has met discharge criteria at this time. Discharge instructions discussed with patient and family.  Patient and family verbalized understanding of instructions.  Paper prescriptions given to patient for following medications: None . Rolling walker given to patient prior to discharge in room.  Patient instructed to go to follow up appointment with surgeon that was scheduled prior to previous discharge. Denies any further questions or concerns.  Patient discharged home with family via wheelchair.  Instructed to call office with any further questions or concerns.

## 2017-05-11 NOTE — Care Management Note (Signed)
Case Management Note  Patient Details  Name: Kamori Barbier MRN: [id number] Date of Birth: [birth year]  Subjective/Objective:     Patient had a T10-11 micordisectomy on 05/06/2017. He went home and began to have intractable lower back pain at home. He was admited back with intractable pain.                 Action/Plan:  Patient has no need for [facility] therapies. CM has ordered RW.   Expected Discharge Date:  05/11/17               Expected Discharge Plan:  Home/Self Care  In-House Referral:  NA  Discharge planning Services  CM Consult  Post Acute Care Choice:  Durable Medical Equipment Choice offered to:  Patient  DME Arranged:  Gilford Rile rolling DME Agency:  [place]:  NA [place] Agency:  NA  Status of Service:  Completed, signed off  If discussed at [place] of Stay Meetings, dates discussed:    Additional Comments:  Ninfa Meeker, RN 05/11/2017, 2:14 PM

## 2017-05-11 NOTE — Progress Notes (Signed)
Physical Therapy Treatment Patient Details Name: George Wise MRN: [id number] DOB: 12/31/1970 Today's Date: 05/11/2017    History of Present Illness Patient had a T10-11 micordisectomy on 05/06/2017. He went home and began to have intractable lower back pain at home. He was admited back with intractable pain.     PT Comments    Session focused on gait and stair training.Outside of new onset of dizziness, pt is demonstrating increase of independence with mobility and improved activity tolerance.  Pt ambulated 300 feet with min guarding due to LOBx4 with head turns.  Pt reports new onset of dizziness and veritgo like symptoms begining this morning when rising out of bed that have lingered with imbalance throughout the day. (See General Comments below). Pt also ascended/descended 4 stairs with min guarding. Discussed findings with patient and family and educated to discuss with medical team today and pursue referral to outpatient neurologic PT if symptoms persist. PT to follow.    Follow Up Recommendations        Equipment Recommendations  Rolling walker with 5" wheels    Recommendations for Other Services       Precautions / Restrictions Precautions Precautions: Back Restrictions Weight Bearing Restrictions: No    Mobility  Bed Mobility               General bed mobility comments: OOB in chair upon arrival  Transfers Overall transfer level: Needs assistance Equipment used: Rolling walker (2 wheeled) Transfers: Sit to/from Stand Sit to Stand: Supervision         General transfer comment: Pt able to sit<>stand without use of RW. supervision for balance.  Ambulation/Gait Ambulation/Gait assistance: Min guard Ambulation Distance (Feet): 250 Feet Assistive device: None Gait Pattern/deviations: Step-through pattern Gait velocity: normal   General Gait Details: Pt ambulating with good mechanics. LOB x4 provoked by change in head movement. States he is able to  correct balance if he fixates on a target and not move his head. min guarding for safety throughout session.   Stairs Stairs: Yes   Stair Management: No rails Number of Stairs: 4 General stair comments: platform stair x4 without UE support. Min guard for safety.   Wheelchair Mobility    Modified Rankin (Stroke Patients Only)       Balance Overall balance assessment: Needs assistance Sitting-balance support: Feet supported Sitting balance-Leahy Scale: Fair     Standing balance support: No upper extremity supported;During functional activity Standing balance-Leahy Scale: Fair Standing balance comment: Pt exp LOB during transfers and ambulation with head movements. Able to stablize himself without external support throughout session. Decreases imbalance when fixates on traget and keeps his head still.                              Cognition Arousal/Alertness: Awake/alert Behavior During Therapy: WFL for tasks assessed/performed Overall Cognitive Status: Within Functional Limits for tasks assessed                                        Exercises      General Comments General comments (skin integrity, edema, etc.): Pt reports new onset of dizziness and veritgo like symptoms begining this morning when rising out of bed that have lingered with imbalance throughout the day. During session, pt exp loss of balance with transfers/ambulation with head motions. Vitals were stable and within normal  ranges throughout session. no orthostatic BP drops. Unable to detect any nystagmus in eye exam with negative head thrust, smooth pursuit, saccade and eye cover tests. Pt refuses to perform Dix-Hallpike due to spinal surgery/pain.      Pertinent Vitals/Pain Pain Assessment: 0-10 Pain Score: 5  Pain Location: low back into right buttock  Pain Descriptors / Indicators: Aching Pain Intervention(s): Limited activity within patient's tolerance;Monitored during session     Home Living                      Prior Function            PT Goals (current goals can now be found in the care plan section) Acute Rehab PT Goals Patient Stated Goal: to return home PT Goal Formulation: With patient/family Time For Goal Achievement: 05/23/17 Potential to Achieve Goals: Good Progress towards PT goals: Progressing toward goals    Frequency    Min 5X/week      PT Plan Current plan remains appropriate    Co-evaluation              AM-PAC PT "6 Clicks" Daily Activity  Outcome Measure  Difficulty turning over in bed (including adjusting bedclothes, sheets and blankets)?: A Little Difficulty moving from lying on back to sitting on the side of the bed? : A Little Difficulty sitting down on and standing up from a chair with arms (e.g., wheelchair, bedside commode, etc,.)?: A Little Help needed moving to and from a bed to chair (including a wheelchair)?: A Little Help needed walking in hospital room?: A Little Help needed climbing 3-5 steps with a railing? : A Little 6 Click Score: 18    End of Session         PT Visit Diagnosis: Unsteadiness on feet (R26.81);Pain;Difficulty in walking, not elsewhere classified (R26.2)     Time: 9030-0923 PT Time Calculation (min) (ACUTE ONLY): 44 min  Charges:  $Gait Training: 23-37 mins                    G Codes:       Reinaldo Berber, PT, DPT Acute Rehab Services Pager: [phone]     Reinaldo Berber 05/11/2017, 12:30 PM

## 2017-05-12 NOTE — Discharge Summary (Signed)
  Physician Discharge Summary  Patient ID: George Wise MRN: [id number] DOB/AGE: [birth year] 46 y.o.  Admit date: 05/08/2017 Discharge date: 05/12/2017  Admission Diagnoses:Herniated nucleus polyposis T10-11  Discharge Diagnoses:  same Active Problems:   Acute post-operative pain   Intractable back pain   Discharged Condition: good  Hospital Course:  Patient admitted the hospital underwent transpedicular diskectomy at T10-11 postop the patient initially did fairly well and was discharged.  Patient pounds back in severe pain workup with CT scan showed no acute abnormality patient was stabilized on IV steroids and Neurontin and discharged with scheduled follow-up in 1-2 weeks.  Consults: Significant Diagnostic Studies: Treatments: Discharge Exam: Blood pressure 120/68, pulse 75, temperature 98 F (36.7 C), temperature source Oral, resp. rate 18, height 6\' 2"  (1.88 m), weight 127.9 kg (282 lb), SpO2 100 %.   Awake alert strength 5/5 1 clean dry and intact  Disposition:  home  Discharge Instructions    Walker rolling    Complete by:  As directed      Allergies as of 05/11/2017   No Known Allergies     Medication List    TAKE these medications   atorvastatin 40 MG tablet Commonly known as:  LIPITOR TAKE 1 TABLET BY MOUTH DAILY. What changed:  See the new instructions.   HYDROcodone-acetaminophen 5-325 MG tablet Commonly known as:  NORCO/VICODIN Take 1-2 tablets by mouth every 4 (four) hours as needed. What changed:  how much to take  reasons to take this   lisinopril 40 MG tablet Commonly known as:  PRINIVIL,ZESTRIL TAKE 1 TABLET BY MOUTH DAILY. What changed:  how much to take  how to take this  when to take this  additional instructions   metaxalone 800 MG tablet Commonly known as:  SKELAXIN Take 1 tablet (800 mg total) by mouth 3 (three) times daily as needed for muscle spasms.   metFORMIN 850 MG tablet Commonly known as:  GLUCOPHAGE Take 850  mg by mouth 2 (two) times daily with a meal.   methocarbamol 750 MG tablet Commonly known as:  ROBAXIN Take 1 tablet (750 mg total) by mouth every 6 (six) hours as needed for muscle spasms.   naproxen sodium 220 MG tablet Commonly known as:  ALEVE Take 440 mg by mouth daily as needed (PAIN).   NEXIUM 40 MG capsule Generic drug:  esomeprazole TAKE 1 CAPSULE BY MOUTH 2 TIMES DAILY BEFORE A MEAL   oxyCODONE-acetaminophen 5-325 MG tablet Commonly known as:  PERCOCET/ROXICET Take 1-2 tablets by mouth every 4 (four) hours as needed for moderate pain.   PARoxetine 10 MG tablet Commonly known as:  PAXIL TAKE 1 TABLET BY MOUTH EVERY MORNING. What changed:  See the new instructions.      Follow-up Information    Kary Kos, MD Follow up.   Specialty:  Neurosurgery Why:  Keep follow up appointment. Call office with any questions or concerns Contact information: [address] [place] [address] [phone]           Signed: Elaina Hoops 05/12/2017, 8:12 AM

## 2017-05-13 ENCOUNTER — Other Ambulatory Visit: Payer: Self-pay | Admitting: *Deleted

## 2017-05-13 NOTE — Patient Outreach (Signed)
[facility]) Care Management  05/13/2017  George Wise [birth year] [id number]   Subjective: Telephone call to patient's home number, spoke with patient, and HIPAA verified.  Discussed [facility] Care Management [facility] Transition of care follow up, patient voiced understanding, and is in agreement to follow up.  Patient states he is doing well, feeling much better, and has follow up appointment with surgeon on 05/20/17.  States he is separated from wife George Wise), does not want any information shared with wife, advised this RNCM would not share any information with anyone with his consent, patient voice understanding, and is very appreciative.  Patient voices understanding of medical diagnosis, surgery,  and treatment plan.  States he is accessing the following [facility] benefits: outpatient pharmacy, hospital indemnity (not chosen), family medical leave act (FMLA) not need, and is on disability. Patient states he does not have any education material, transition of care, care coordination, disease management, disease monitoring, transportation, community resource, or pharmacy needs at this time.  States he is very appreciative of the follow up and is in agreement to receive [place] Management information.     Objective: Per KPN (Knowledge Performance Now, point of care tool) and chart review, patient hospitalized  05/08/17 -05/12/17 and 05/06/17 - 05/07/17  for Acute post-operative pain, Herniated nucleus polyposis T10-11, with left sided T11 radiculopathy and myelopathy.    Status post   transpedicular decompression discectomy T10-T11 from the left with microdissection of the left T11 nerve root microscopic discectomy on 05/06/17.     Patient has a history of diabetes, hypertension, hyperlipidemia, and testicular cancer.     Assessment:  Received [facility] Transition of care referral on 05/11/17.   Transition of care follow up completed, no care management needs, and will proceed with case  closure.     Plan: RNCM will send patient successful outreach letter, [facility] pamphlet, and magnet. RNCM will send case closure due to follow up completed / no care management needs request to [name].    George Wise H. Annia Friendly, BSN, [facility] Telephonic CM Phone: [phone] Fax: [phone]

## 2017-05-14 ENCOUNTER — Encounter: Payer: Self-pay | Admitting: *Deleted

## 2017-05-19 MED FILL — DOXYCYCLINE HYCLATE 100 MG: 100 | 5 days supply | Qty: 10 | Fill #0

## 2017-06-01 DIAGNOSIS — H21233 Degeneration of iris (pigmentary), bilateral: Secondary | ICD-10-CM | POA: Diagnosis not present

## 2017-06-01 DIAGNOSIS — H52203 Unspecified astigmatism, bilateral: Secondary | ICD-10-CM | POA: Diagnosis not present

## 2017-06-01 DIAGNOSIS — E119 Type 2 diabetes mellitus without complications: Secondary | ICD-10-CM | POA: Diagnosis not present

## 2017-06-08 MED FILL — ATORVASTATIN 40 MG TABLET: 40 | 90 days supply | Qty: 90 | Fill #1

## 2017-06-08 MED FILL — LISINOPRIL 20 MG TABLET: 20 | 90 days supply | Qty: 90 | Fill #1

## 2017-06-08 MED FILL — PARoxetine HCL 10 MG TABS: 10 | 90 days supply | Qty: 90 | Fill #1

## 2017-06-18 DIAGNOSIS — M5414 Radiculopathy, thoracic region: Secondary | ICD-10-CM | POA: Diagnosis not present

## 2017-06-18 DIAGNOSIS — M2560 Stiffness of unspecified joint, not elsewhere classified: Secondary | ICD-10-CM | POA: Diagnosis not present

## 2017-06-18 DIAGNOSIS — M6281 Muscle weakness (generalized): Secondary | ICD-10-CM | POA: Diagnosis not present

## 2017-06-19 DIAGNOSIS — Z6837 Body mass index (BMI) 37.0-37.9, adult: Secondary | ICD-10-CM | POA: Diagnosis not present

## 2017-06-19 DIAGNOSIS — I10 Essential (primary) hypertension: Secondary | ICD-10-CM | POA: Diagnosis not present

## 2017-06-19 DIAGNOSIS — E7800 Pure hypercholesterolemia, unspecified: Secondary | ICD-10-CM | POA: Diagnosis not present

## 2017-06-19 DIAGNOSIS — Z299 Encounter for prophylactic measures, unspecified: Secondary | ICD-10-CM | POA: Diagnosis not present

## 2017-06-19 DIAGNOSIS — E1165 Type 2 diabetes mellitus with hyperglycemia: Secondary | ICD-10-CM | POA: Diagnosis not present

## 2017-06-29 DIAGNOSIS — M2560 Stiffness of unspecified joint, not elsewhere classified: Secondary | ICD-10-CM | POA: Diagnosis not present

## 2017-06-29 DIAGNOSIS — M5414 Radiculopathy, thoracic region: Secondary | ICD-10-CM | POA: Diagnosis not present

## 2017-06-29 DIAGNOSIS — M6281 Muscle weakness (generalized): Secondary | ICD-10-CM | POA: Diagnosis not present

## 2018-09-09 ENCOUNTER — Ambulatory Visit: Payer: 59 | Admitting: Pulmonary Disease

## 2018-09-09 ENCOUNTER — Encounter: Payer: Self-pay | Admitting: Pulmonary Disease

## 2018-09-09 VITALS — BP 120/82 | HR 85 | Ht 73.0 in | Wt 286.0 lb

## 2018-09-09 DIAGNOSIS — G4733 Obstructive sleep apnea (adult) (pediatric): Secondary | ICD-10-CM | POA: Diagnosis not present

## 2018-09-09 NOTE — Progress Notes (Signed)
Subjective:    Patient ID: George Wise, male    DOB: December 18, 1970, 48 y.o.   MRN: [id number]  Patient with a history of obstructive sleep apnea diagnosed about 8 years ago for which he has been using CPAP therapy  His machine became dysfunctional in the last few days Diagnosed with sleep apnea 8 years ago Very compliant with CPAP use Usually goes to bed about 10 PM, wakes up about 6:20 AM Will wake up about once or twice during the night to go to the bathroom Denies morning headaches Denies dryness of his mouth in the mornings His memory is good  Dad has obstructive sleep apnea   Review of Systems  Constitutional: Positive for fatigue.  HENT: Negative.   Eyes: Negative.   Respiratory: Positive for apnea and cough.   Cardiovascular: Negative.   Gastrointestinal: Negative.   Endocrine: Negative.   Genitourinary: Negative.   Musculoskeletal: Positive for joint swelling.  Skin: Negative.   Allergic/Immunologic: Negative.   Neurological: Negative.   Hematological: Negative.   Psychiatric/Behavioral: Negative.    Past Medical History:  Diagnosis Date  . Arthritis   . Diabetes mellitus without complication ([place])   . GERD (gastroesophageal reflux disease)   . Glucose intolerance (impaired glucose tolerance)   . Hyperlipidemia   . Hypertension   . Obesity   . OSA (obstructive sleep apnea) 02/17/2011  . PONV (postoperative nausea and vomiting)   . Sleep apnea   . Testicular cancer [facility]) 2001   Social History   Socioeconomic History  . Marital status: Married    Spouse name: Not on file  . Number of children: 1  . Years of education: Not on file  . Highest education level: Not on file  Occupational History  . Occupation: truck Animator Needs  . Financial resource strain: Not on file  . Food insecurity:    Worry: Not on file    Inability: Not on file  . Transportation needs:    Medical: Not on file    Non-medical: Not on file  Tobacco Use  . Smoking  status: Never Smoker  . Smokeless tobacco: Never Used  Substance and Sexual Activity  . Alcohol use: No  . Drug use: No  . Sexual activity: Not on file  Lifestyle  . Physical activity:    Days per week: Not on file    Minutes per session: Not on file  . Stress: Not on file  Relationships  . Social connections:    Talks on phone: Not on file    Gets together: Not on file    Attends religious service: Not on file    Active member of club or organization: Not on file    Attends meetings of clubs or organizations: Not on file    Relationship status: Not on file  . Intimate partner violence:    Fear of current or ex partner: Not on file    Emotionally abused: Not on file    Physically abused: Not on file    Forced sexual activity: Not on file  Other Topics Concern  . Not on file  Social History Narrative  . Not on file       Objective:   Physical Exam Constitutional:      General: He is in acute distress.     Appearance: Normal appearance.  HENT:     Head: Normocephalic and atraumatic.     Nose: Nose normal. No congestion or rhinorrhea.  Mouth/Throat:     Mouth: Mucous membranes are moist.     Pharynx: No oropharyngeal exudate.  Eyes:     Extraocular Movements: Extraocular movements intact.     Pupils: Pupils are equal, round, and reactive to light.  Neck:     Musculoskeletal: Normal range of motion and neck supple. No neck rigidity or muscular tenderness.  Cardiovascular:     Rate and Rhythm: Normal rate and regular rhythm.     Pulses: Normal pulses.     Heart sounds: Normal heart sounds. No murmur.  Pulmonary:     Effort: Pulmonary effort is normal. No respiratory distress.     Breath sounds: Normal breath sounds.  Neurological:     Mental Status: He is alert.     Results of the Epworth flowsheet 09/09/2018  Sitting and reading 3  Watching TV 3  Sitting, inactive in a public place (e.g. a theatre or a meeting) 1  As a passenger in a car for an hour  without a break 1  Lying down to rest in the afternoon when circumstances permit 2  Sitting and talking to someone 0  Sitting quietly after a lunch without alcohol 0  In a car, while stopped for a few minutes in traffic 0  Total score 10      Assessment & Plan:  .  History of obstructive sleep apnea -Past history of OSA -Significant daytime symptoms with not using his CPAP recently-dysfunctional machine .  Obesity   .  Plan:  Pathophysiology of obstructive sleep apnea discussed with patient  Treatment options for sleep disordered breathing discussed with patient  We will schedule patient for home sleep study, treatment options as discussed  I will see him back in the office in about 1 to 3 months following initiation of treatment

## 2018-09-09 NOTE — Patient Instructions (Signed)
History of moderate obstructive sleep apnea  We will set you up with a home sleep study We will see you back in about 1 to 3 months following your study  Call with significant concerns   Sleep Apnea Sleep apnea is a condition in which breathing pauses or becomes shallow during sleep. Episodes of sleep apnea usually last 10 seconds or longer, and they may occur as many as 20 times an hour. Sleep apnea disrupts your sleep and keeps your body from getting the rest that it needs. This condition can increase your risk of certain health problems, including:  Heart attack.  Stroke.  Obesity.  Diabetes.  Heart failure.  Irregular heartbeat. There are three kinds of sleep apnea:  Obstructive sleep apnea. This kind is caused by a blocked or collapsed airway.  Central sleep apnea. This kind happens when the part of the brain that controls breathing does not send the correct signals to the muscles that control breathing.  Mixed sleep apnea. This is a combination of obstructive and central sleep apnea. What are the causes? The most common cause of this condition is a collapsed or blocked airway. An airway can collapse or become blocked if:  Your throat muscles are abnormally relaxed.  Your tongue and tonsils are larger than normal.  You are overweight.  Your airway is smaller than normal. What increases the risk? This condition is more likely to develop in people who:  Are overweight.  Smoke.  Have a smaller than normal airway.  Are elderly.  Are male.  Drink alcohol.  Take sedatives or tranquilizers.  Have a family history of sleep apnea. What are the signs or symptoms? Symptoms of this condition include:  Trouble staying asleep.  Daytime sleepiness and tiredness.  Irritability.  Loud snoring.  Morning headaches.  Trouble concentrating.  Forgetfulness.  Decreased interest in sex.  Unexplained sleepiness.  Mood swings.  Personality  changes.  Feelings of depression.  Waking up often during the night to urinate.  Dry mouth.  Sore throat. How is this diagnosed? This condition may be diagnosed with:  A medical history.  A physical exam.  A series of tests that are done while you are sleeping (sleep study). These tests are usually done in a sleep lab, but they may also be done at home. How is this treated? Treatment for this condition aims to restore normal breathing and to ease symptoms during sleep. It may involve managing health issues that can affect breathing, such as high blood pressure or obesity. Treatment may include:  Sleeping on your side.  Using a decongestant if you have nasal congestion.  Avoiding the use of depressants, including alcohol, sedatives, and narcotics.  Losing weight if you are overweight.  Making changes to your diet.  Quitting smoking.  Using a device to open your airway while you sleep, such as: ? An oral appliance. This is a custom-made mouthpiece that shifts your lower jaw forward. ? A continuous positive airway pressure (CPAP) device. This device delivers oxygen to your airway through a mask. ? A nasal expiratory positive airway pressure (EPAP) device. This device has valves that you put into each nostril. ? A bi-level positive airway pressure (BPAP) device. This device delivers oxygen to your airway through a mask.  Surgery if other treatments do not work. During surgery, excess tissue is removed to create a wider airway. It is important to get treatment for sleep apnea. Without treatment, this condition can lead to:  High blood pressure.  Coronary  artery disease.  (Men) An inability to achieve or maintain an erection (impotence).  Reduced thinking abilities. Follow these instructions at home:  Make any lifestyle changes that your health care provider recommends.  Eat a healthy, well-balanced diet.  Take over-the-counter and prescription medicines only as told  by your health care provider.  Avoid using depressants, including alcohol, sedatives, and narcotics.  Take steps to lose weight if you are overweight.  If you were given a device to open your airway while you sleep, use it only as told by your health care provider.  Do not use any tobacco products, such as cigarettes, chewing tobacco, and e-cigarettes. If you need help quitting, ask your health care provider.  Keep all follow-up visits as told by your health care provider. This is important. Contact a health care provider if:  The device that you received to open your airway during sleep is uncomfortable or does not seem to be working.  Your symptoms do not improve.  Your symptoms get worse. Get help right away if:  You develop chest pain.  You develop shortness of breath.  You develop discomfort in your back, arms, or stomach.  You have trouble speaking.  You have weakness on one side of your body.  You have drooping in your face. These symptoms may represent a serious problem that is an emergency. Do not wait to see if the symptoms will go away. Get medical help right away. Call your local emergency services (911 in the [place] Do not drive yourself to the hospital. This information is not intended to replace advice given to you by your health care provider. Make sure you discuss any questions you have with your health care provider. Document Released: 06/20/2002 Document Revised: 01/26/2017 Document Reviewed: 04/09/2015 Elsevier Interactive Patient Education  2019 [employer].

## 2018-09-20 ENCOUNTER — Telehealth: Payer: Self-pay | Admitting: Pulmonary Disease

## 2018-09-20 NOTE — Telephone Encounter (Signed)
LM on pt's VM to schedule HST.

## 2018-09-20 NOTE — Telephone Encounter (Signed)
Called patient unable to reach left message to give us a call back.

## 2018-09-21 NOTE — Telephone Encounter (Signed)
Sched for 3/17 @ 4:00.  Nothing further needed at this time.

## 2018-09-28 ENCOUNTER — Other Ambulatory Visit: Payer: Self-pay

## 2018-09-28 DIAGNOSIS — G4733 Obstructive sleep apnea (adult) (pediatric): Secondary | ICD-10-CM

## 2018-10-07 ENCOUNTER — Telehealth: Payer: Self-pay | Admitting: Pulmonary Disease

## 2018-10-07 DIAGNOSIS — G4733 Obstructive sleep apnea (adult) (pediatric): Secondary | ICD-10-CM

## 2018-10-07 NOTE — Telephone Encounter (Signed)
Can we check if there is a report on [name] home sleep study and let him know-check with Nira Conn  I had no pending studies when I was in the office on Friday

## 2018-10-07 NOTE — Telephone Encounter (Signed)
Spoke with Benjamine Mola and she is requesting sleep study results. AO please advise on results.

## 2018-10-08 DIAGNOSIS — G4733 Obstructive sleep apnea (adult) (pediatric): Secondary | ICD-10-CM | POA: Diagnosis not present

## 2018-10-08 NOTE — Telephone Encounter (Signed)
I have called and spoke with Dr. Ander Slade this has not been read yet he will come by today and read this. I have called George Wise and left a message to make her aware.

## 2018-10-08 NOTE — Telephone Encounter (Signed)
Dr. Ander Slade has reviewed the home sleep test this showed Moderate osa.  HST -09/28/18 Recommendations   Treatment options are CPAP with the settings auto 5 to 15.    Weight loss measures .   Advise against driving while sleepy & against medication with sedative side effects.    Make Telephone appointment for 3 months for compliance with download with NP.  ATC lmom will call back

## 2018-10-08 NOTE — Telephone Encounter (Signed)
I will call her back once Dr. Ander Slade results this.

## 2018-10-11 NOTE — Telephone Encounter (Signed)
Call made to patient, made aware of HST results. Voiced understanding. Order placed for cpap. Virtual visit made for cpap compliance. Nothing further is needed at this time.

## 2018-11-17 ENCOUNTER — Telehealth: Payer: Self-pay | Admitting: Pulmonary Disease

## 2018-11-17 DIAGNOSIS — G4733 Obstructive sleep apnea (adult) (pediatric): Secondary | ICD-10-CM

## 2018-11-17 NOTE — Telephone Encounter (Signed)
Order was placed for pt's CPAP set up 10/08/2018 with settings auto 5 to 15cm.  Download has been printed of pt's CPAP usage and the compliance report is from 10/18/2018-11/16/2018.  Called and spoke with pt's fiancee George Wise in regards to pt's CPAP. Per George Wise, pt has had the red frowny face come on the machine since pt received the machine. Pt has had the machine about 3 weeks now.  Asked George Wise if pt ever feels any air seeping through and she stated that pt has felt air around the face and a mask test was performed to see if it fit and it did fit good.  Pt and George Wise are wanting to know recommendations that need to be done in regards to the red face coming up on the cpap machine. George Wise, please advise on this for pt and George Wise. Thanks!

## 2018-11-17 NOTE — Telephone Encounter (Signed)
Called and spoke with pt's fiancee George Wise again letting her know the info per Derl Barrow. Stated to her that we were going to order mask fit due to significant air leaking and George Wise verbalized understanding. Also stated to her that they could also call [place] in regards to this if they wanted to and she expressed understanding. Order has been placed for the mask fit. Nothing further needed.

## 2018-11-17 NOTE — Telephone Encounter (Signed)
He's not having many event but has significant air leaking. Recommend mask fitting.

## 2018-12-19 NOTE — Progress Notes (Signed)
Virtual Visit via Telephone Note  I connected with George Wise on 12/20/18 at  4:00 PM EDT by telephone and verified that I am speaking with the correct person using two identifiers.  Location: Patient: Home Provider: [facility] - [address], [place], [place], [place] [address]   I discussed the limitations, risks, security and privacy concerns of performing an evaluation and management service by telephone and the availability of in person appointments. I also discussed with the patient that there may be a patient responsible charge related to this service. The patient expressed understanding and agreed to proceed.  Patient consented to consult via telephone: Yes People present and their role in pt care: Pt   History of Present Illness: 48 year old male never smoker followed in our office for moderate osa Pt: Dr. Ander Slade  Chief complaint: Moderate OSA on CPAP    48 year old male never smoker followed in our office for moderate obstructive sleep apnea.  Patient has been managed on CPAP therapy for over 8 years.  Patient CPAP broke and he had a break in therapy which then resulted in him having to get a new home sleep study.  March/2020 home sleep study shows an AHI of 15.6.  Patient has restarted CPAP therapy and has excellent compliance.  CPAP compliance report listed below:  11/17/2018-12/16/2018-CPAP compliance report- 30 of the last 30 days use, all 30 those days greater than 4 hours, average usage 7 hours and 57 minutes, APAP setting 5-15, AHI 1  Patient reporting that overall he is felt a significant clinical improvement since resuming CPAP therapy.  The one issue is having is he has occasional leaks from his mask.  Patient contacted our office a month ago regarding this and had a mask fitting ordered but nothing has happened regarding this likely due to COVID-19 restrictions.  Patient has previously tried a nasal mask which he did not like.  He is currently using  a large fullface mask.  Observations/Objective:  09/28/18 - HST - AHI 15.6, SaO2 low 81%  Results of the Epworth flowsheet 09/09/2018  Sitting and reading 3  Watching TV 3  Sitting, inactive in a public place (e.g. a theatre or a meeting) 1  As a passenger in a car for an hour without a break 1  Lying down to rest in the afternoon when circumstances permit 2  Sitting and talking to someone 0  Sitting quietly after a lunch without alcohol 0  In a car, while stopped for a few minutes in traffic 0  Total score 10     Assessment and Plan:  OSA (obstructive sleep apnea) Assessment: Last weight in chart 286 pounds March/2020 home sleep study showing moderate obstructive sleep apnea AHI 15.6 CPAP compliance report today shows excellent compliance and well-controlled AHI of 1  Plan: Proceed forward with mask fitting at sleep lab Follow-up with our office in 1 year Contact our office sooner if you are having difficulty using your CPAP   Follow Up Instructions:  Return in about 1 year (around 12/20/2019), or if symptoms worsen or fail to improve, for Follow up with Dr. Ander Slade, Follow up with [name] FNP-C.   I discussed the assessment and treatment plan with the patient. The patient was provided an opportunity to ask questions and all were answered. The patient agreed with the plan and demonstrated an understanding of the instructions.   The patient was advised to call back or seek an in-person evaluation if the  symptoms worsen or if the condition fails to improve as anticipated.  I provided 24 minutes of non-face-to-face time during this encounter.   [name], NP

## 2018-12-20 ENCOUNTER — Encounter: Payer: Self-pay | Admitting: Pulmonary Disease

## 2018-12-20 ENCOUNTER — Other Ambulatory Visit: Payer: Self-pay

## 2018-12-20 ENCOUNTER — Ambulatory Visit (INDEPENDENT_AMBULATORY_CARE_PROVIDER_SITE_OTHER): Payer: 59 | Admitting: Pulmonary Disease

## 2018-12-20 DIAGNOSIS — G4733 Obstructive sleep apnea (adult) (pediatric): Secondary | ICD-10-CM | POA: Diagnosis not present

## 2018-12-20 NOTE — Patient Instructions (Addendum)
We have reordered your mask fitting at [facility]  Please send order for supplies, etc to DME company: [employer]  We recommend that you continue using your CPAP daily >>>Keep up the hard work using your device >>> Goal should be wearing this for the entire night that you are sleeping, at least 4 to 6 hours  Remember:  . Do not drive or operate heavy machinery if tired or drowsy.  . Please notify the supply company and office if you are unable to use your device regularly due to missing supplies or machine being broken.  . Work on maintaining a healthy weight and following your recommended nutrition plan  . Maintain proper daily exercise and movement  . Maintaining proper use of your device can also help improve management of other chronic illnesses such as: Blood pressure, blood sugars, and weight management.   BiPAP/ CPAP Cleaning:  >>>Clean weekly, with Dawn soap, and bottle brush.  Set up to air dry.   Return in about 1 year (around 12/20/2019), or if symptoms worsen or fail to improve, for Follow up with Dr. Ander Slade, Follow up with [name] FNP-C.   Coronavirus (COVID-19) Are you at risk?  Are you at risk for the Coronavirus (COVID-19)?  To be considered HIGH RISK for Coronavirus (COVID-19), you have to meet the following criteria:  . Traveled to [place], [place], [place], [place] or [place]; or in the [place] to [place], [place], [place], or [place]; and have fever, cough, and shortness of breath within the last 2 weeks of travel OR . Been in close contact with a person diagnosed with COVID-19 within the last 2 weeks and have fever, cough, and shortness of breath . IF YOU DO NOT MEET THESE CRITERIA, YOU ARE CONSIDERED LOW RISK FOR COVID-19.  What to do if you are HIGH RISK for COVID-19?  Marland Kitchen If you are having a medical emergency, call 911. . Seek medical care right away. Before you go to a doctor's office, urgent care or emergency department, call ahead and  tell them about your recent travel, contact with someone diagnosed with COVID-19, and your symptoms. You should receive instructions from your physician's office regarding next steps of care.  . When you arrive at healthcare provider, tell the healthcare staff immediately you have returned from visiting [place], [place], [place], [place] or [place]; or traveled in the [place] to [place], [place], [place], or [place]; in the last two weeks or you have been in close contact with a person diagnosed with COVID-19 in the last 2 weeks.   . Tell the health care staff about your symptoms: fever, cough and shortness of breath. . After you have been seen by a medical provider, you will be either: o Tested for (COVID-19) and discharged home on quarantine except to seek medical care if symptoms worsen, and asked to  - Stay home and avoid contact with others until you get your results (4-5 days)  - Avoid travel on public transportation if possible (such as bus, train, or airplane) or o Sent to the Emergency Department by EMS for evaluation, COVID-19 testing, and possible admission depending on your condition and test results.  What to do if you are LOW RISK for COVID-19?  Reduce your risk of any infection by using the same precautions used for avoiding the common cold or flu:  Marland Kitchen Wash your hands often with soap and warm water for at least 20 seconds.  If  soap and water are not readily available, use an alcohol-based hand sanitizer with at least 60% alcohol.  . If coughing or sneezing, cover your mouth and nose by coughing or sneezing into the elbow areas of your shirt or coat, into a tissue or into your sleeve (not your hands). . Avoid shaking hands with others and consider head nods or verbal greetings only. . Avoid touching your eyes, nose, or mouth with unwashed hands.  . Avoid close contact with people who are sick. . Avoid places or events with large numbers of people in one location, like  concerts or sporting events. . Carefully consider travel plans you have or are making. . If you are planning any travel outside or inside the [place], visit the CDC's Travelers' Health webpage for the latest health notices. . If you have some symptoms but not all symptoms, continue to monitor at home and seek medical attention if your symptoms worsen. . If you are having a medical emergency, call 911.   [place] / e-Visit: [email or web address]         [facility]: [facility]: [phone]                   [facility]: [phone]           It is flu season:   >>> Best ways to protect herself from the flu: Receive the yearly flu vaccine, practice good hand hygiene washing with soap and also using hand sanitizer when available, eat a nutritious meals, get adequate rest, hydrate appropriately   Please contact the office if your symptoms worsen or you have concerns that you are not improving.   Thank you for choosing [facility] for your healthcare, and for allowing [place] to partner with you on your healthcare journey. I am thankful to be able to provide care to you today.   [name] FNP-C     Sleep Apnea Sleep apnea affects breathing during sleep. It causes breathing to stop for a short time or to become shallow. It can also increase the risk of:  Heart attack.  Stroke.  Being very overweight (obese).  Diabetes.  Heart failure.  Irregular heartbeat. The goal of treatment is to help you breathe normally again. What are the causes? There are three kinds of sleep apnea:  Obstructive sleep apnea. This is caused by a blocked or collapsed airway.  Central sleep apnea. This happens when the brain does not send the right signals to the muscles that control breathing.  Mixed sleep apnea. This is a combination of  obstructive and central sleep apnea. The most common cause of this condition is a collapsed or blocked airway. This can happen if:  Your throat muscles are too relaxed.  Your tongue and tonsils are too large.  You are overweight.  Your airway is too small. What increases the risk?  Being overweight.  Smoking.  Having a small airway.  Being older.  Being male.  Drinking alcohol.  Taking medicines to calm yourself (sedatives or tranquilizers).  Having family members with the condition. What are the signs or symptoms?  Trouble staying asleep.  Being sleepy or tired during the day.  Getting angry a lot.  Loud snoring.  Headaches in the morning.  Not being able to focus your mind (concentrate).  Forgetting things.  Less interest in sex.  Mood swings.  Personality changes.  Feelings of sadness (  depression).  Waking up a lot during the night to pee (urinate).  Dry mouth.  Sore throat. How is this diagnosed?  Your medical history.  A physical exam.  A test that is done when you are sleeping (sleep study). The test is most often done in a sleep lab but may also be done at home. How is this treated?   Sleeping on your side.  Using a medicine to get rid of mucus in your nose (decongestant).  Avoiding the use of alcohol, medicines to help you relax, or certain pain medicines (narcotics).  Losing weight, if needed.  Changing your diet.  Not smoking.  Using a machine to open your airway while you sleep, such as: ? An oral appliance. This is a mouthpiece that shifts your lower jaw forward. ? A CPAP device. This device blows air through a mask when you breathe out (exhale). ? An EPAP device. This has valves that you put in each nostril. ? A BPAP device. This device blows air through a mask when you breathe in (inhale) and breathe out.  Having surgery if other treatments do not work. It is important to get treatment for sleep apnea. Without  treatment, it can lead to:  High blood pressure.  Coronary artery disease.  In men, not being able to have an erection (impotence).  Reduced thinking ability. Follow these instructions at home: Lifestyle  Make changes that your doctor recommends.  Eat a healthy diet.  Lose weight if needed.  Avoid alcohol, medicines to help you relax, and some pain medicines.  Do not use any products that contain nicotine or tobacco, such as cigarettes, e-cigarettes, and chewing tobacco. If you need help quitting, ask your doctor. General instructions  Take over-the-counter and prescription medicines only as told by your doctor.  If you were given a machine to use while you sleep, use it only as told by your doctor.  If you are having surgery, make sure to tell your doctor you have sleep apnea. You may need to bring your device with you.  Keep all follow-up visits as told by your doctor. This is important. Contact a doctor if:  The machine that you were given to use during sleep bothers you or does not seem to be working.  You do not get better.  You get worse. Get help right away if:  Your chest hurts.  You have trouble breathing in enough air.  You have an uncomfortable feeling in your back, arms, or stomach.  You have trouble talking.  One side of your body feels weak.  A part of your face is hanging down. These symptoms may be an emergency. Do not wait to see if the symptoms will go away. Get medical help right away. Call your local emergency services (911 in th CPAP and BPAP Information CPAP and BPAP are methods of helping a person breathe with the use of air pressure. CPAP stands for "continuous positive airway pressure." BPAP stands for "bi-level positive airway pressure." In both methods, air is blown through your nose or mouth and into your air passages to help you breathe well. CPAP and BPAP use different amounts of pressure to blow air. With CPAP, the amount of pressure  stays the same while you breathe in and out. With BPAP, the amount of pressure is increased when you breathe in (inhale) so that you can take larger breaths. Your health care provider will recommend whether CPAP or BPAP would be more helpful for you. Why  are CPAP and BPAP treatments used? CPAP or BPAP can be helpful if you have:  Sleep apnea.  Chronic obstructive pulmonary disease (COPD).  Heart failure.  Medical conditions that weaken the muscles of the chest including muscular dystrophy, or neurological diseases such as amyotrophic lateral sclerosis (ALS).  Other problems that cause breathing to be weak, abnormal, or difficult. CPAP is most commonly used for obstructive sleep apnea (OSA) to keep the airways from collapsing when the muscles relax during sleep. How is CPAP or BPAP administered? Both CPAP and BPAP are provided by a small machine with a flexible plastic tube that attaches to a plastic mask. You wear the mask. Air is blown through the mask into your nose or mouth. The amount of pressure that is used to blow the air can be adjusted on the machine. Your health care provider will determine the pressure setting that should be used based on your individual needs. When should CPAP or BPAP be used? In most cases, the mask only needs to be worn during sleep. Generally, the mask needs to be worn throughout the night and during any daytime naps. People with certain medical conditions may also need to wear the mask at other times when they are awake. Follow instructions from your health care provider about when to use the machine. What are some tips for using the mask?   Because the mask needs to be snug, some people feel trapped or closed-in (claustrophobic) when first using the mask. If you feel this way, you may need to get used to the mask. One way to do this is by holding the mask loosely over your nose or mouth and then gradually applying the mask more snugly. You can also gradually  increase the amount of time that you use the mask.  Masks are available in various types and sizes. Some fit over your mouth and nose while others fit over just your nose. If your mask does not fit well, talk with your health care provider about getting a different one.  If you are using a mask that fits over your nose and you tend to breathe through your mouth, a chin strap may be applied to help keep your mouth closed.  The CPAP and BPAP machines have alarms that may sound if the mask comes off or develops a leak.  If you have trouble with the mask, it is very important that you talk with your health care provider about finding a way to make the mask easier to tolerate. Do not stop using the mask. Stopping the use of the mask could have a negative impact on your health. What are some tips for using the machine?  Place your CPAP or BPAP machine on a secure table or stand near an electrical outlet.  Know where the on/off switch is located on the machine.  Follow instructions from your health care provider about how to set the pressure on your machine and when you should use it.  Do not eat or drink while the CPAP or BPAP machine is on. Food or fluids could get pushed into your lungs by the pressure of the CPAP or BPAP.  Do not smoke. Tobacco smoke residue can damage the machine.  For home use, CPAP and BPAP machines can be rented or purchased through home health care companies. Many different brands of machines are available. Renting a machine before purchasing may help you find out which particular machine works well for you.  Keep the CPAP or  BPAP machine and attachments clean. Ask your health care provider for specific instructions. Get help right away if:  You have redness or open areas around your nose or mouth where the mask fits.  You have trouble using the CPAP or BPAP machine.  You cannot tolerate wearing the CPAP or BPAP mask.  You have pain, discomfort, and bloating in your  abdomen. Summary  CPAP and BPAP are methods of helping a person breathe with the use of air pressure.  Both CPAP and BPAP are provided by a small machine with a flexible plastic tube that attaches to a plastic mask.  If you have trouble with the mask, it is very important that you talk with your health care provider about finding a way to make the mask easier to tolerate. This information is not intended to replace advice given to you by your health care provider. Make sure you discuss any questions you have with your health care provider. Document Released: 03/28/2004 Document Revised: 03/02/2018 Document Reviewed: 05/19/2016 Elsevier Interactive Patient Education  2019 [place].). Do not drive yourself to the hospital. Summary  This condition affects breathing during sleep.  The most common cause is a collapsed or blocked airway.  The goal of treatment is to help you breathe normally while you sleep. This information is not intended to replace advice given to you by your health care provider. Make sure you discuss any questions you have with your health care provider. Document Released: 04/08/2008 Document Revised: 02/23/2018 Document Reviewed: 02/23/2018 Elsevier Interactive Patient Education  [employer].

## 2018-12-20 NOTE — Addendum Note (Signed)
Addended by: Vivia Ewing on: 12/20/2018 05:12 PM   Modules accepted: Orders

## 2018-12-20 NOTE — Assessment & Plan Note (Signed)
Assessment: Last weight in chart 286 pounds March/2020 home sleep study showing moderate obstructive sleep apnea AHI 15.6 CPAP compliance report today shows excellent compliance and well-controlled AHI of 1  Plan: Proceed forward with mask fitting at sleep lab Follow-up with our office in 1 year Contact our office sooner if you are having difficulty using your CPAP

## 2019-01-07 ENCOUNTER — Other Ambulatory Visit (HOSPITAL_COMMUNITY)
Admission: RE | Admit: 2019-01-07 | Discharge: 2019-01-07 | Disposition: A | Discharge status: Home / Self Care | Payer: 59 | Source: Ambulatory Visit | Attending: Pulmonary Disease | Admitting: Pulmonary Disease

## 2019-01-07 DIAGNOSIS — Z1159 Encounter for screening for other viral diseases: Secondary | ICD-10-CM | POA: Diagnosis present

## 2019-01-07 LAB — SARS CORONAVIRUS 2 (TAT 6-24 HRS): SARS Coronavirus 2: NEGATIVE

## 2019-01-11 ENCOUNTER — Other Ambulatory Visit: Payer: Self-pay

## 2019-01-11 ENCOUNTER — Ambulatory Visit (HOSPITAL_BASED_OUTPATIENT_CLINIC_OR_DEPARTMENT_OTHER): Payer: 59 | Attending: Pulmonary Disease | Admitting: Radiology

## 2019-01-11 DIAGNOSIS — G4733 Obstructive sleep apnea (adult) (pediatric): Secondary | ICD-10-CM

## 2019-01-17 ENCOUNTER — Emergency Department (HOSPITAL_COMMUNITY): Payer: 59

## 2019-01-17 ENCOUNTER — Other Ambulatory Visit: Payer: Self-pay

## 2019-01-17 ENCOUNTER — Emergency Department (HOSPITAL_COMMUNITY)
Admission: EM | Admit: 2019-01-17 | Discharge: 2019-01-17 | Disposition: A | Discharge status: Home / Self Care | Payer: 59 | Attending: Emergency Medicine | Admitting: Emergency Medicine

## 2019-01-17 DIAGNOSIS — I10 Essential (primary) hypertension: Secondary | ICD-10-CM | POA: Insufficient documentation

## 2019-01-17 DIAGNOSIS — Z7984 Long term (current) use of oral hypoglycemic drugs: Secondary | ICD-10-CM | POA: Insufficient documentation

## 2019-01-17 DIAGNOSIS — E119 Type 2 diabetes mellitus without complications: Secondary | ICD-10-CM | POA: Diagnosis not present

## 2019-01-17 DIAGNOSIS — Z8547 Personal history of malignant neoplasm of testis: Secondary | ICD-10-CM | POA: Insufficient documentation

## 2019-01-17 DIAGNOSIS — M545 Low back pain, unspecified: Secondary | ICD-10-CM

## 2019-01-17 DIAGNOSIS — Z79899 Other long term (current) drug therapy: Secondary | ICD-10-CM | POA: Insufficient documentation

## 2019-01-17 MED ORDER — CYCLOBENZAPRINE HCL 10 MG PO TABS: 10.0000 mg | ORAL_TABLET | Freq: Two times a day (BID) | ORAL | 0 refills | Status: DC | PRN

## 2019-01-17 MED ORDER — OXYCODONE-ACETAMINOPHEN 5-325 MG PO TABS
1.0000 | ORAL_TABLET | Freq: Once | ORAL | Status: AC
  Administered 2019-01-17: 1 via ORAL
  Filled 2019-01-17: qty 1

## 2019-01-17 MED ORDER — MELOXICAM 7.5 MG PO TABS: 7.5000 mg | ORAL_TABLET | Freq: Every day | ORAL | 0 refills | Status: DC

## 2019-01-17 MED ORDER — OXYCODONE-ACETAMINOPHEN 5-325 MG PO TABS: 1.0000 | ORAL_TABLET | Freq: Four times a day (QID) | ORAL | 0 refills | Status: DC | PRN

## 2019-01-17 NOTE — ED Triage Notes (Addendum)
Pt states he had a mechanical fall x 2 days ago and since then his left lower  back has been hurting but has gotten worse today ; pt states that he feels like his feet are " cold" denies any numbness or tingling

## 2019-01-17 NOTE — ED Notes (Signed)
Patient verbalizes understanding of discharge instructions. Opportunity for questioning and answers were provided. Armband removed by staff, pt discharged from ED.  

## 2019-01-17 NOTE — Discharge Instructions (Addendum)
Please read attached information. If you experience any new or worsening signs or symptoms please return to the emergency room for evaluation. Please follow-up with your primary care provider or specialist as discussed. Please use medication prescribed only as directed and discontinue taking if you have any concerning signs or symptoms.  Anti-inflammatory medication can increase to risk of bleeding.  If you develop any abdominal pain dark or bloody stools please discontinue medication immediately and follow-up in the emergency room.  Do not take the muscle relaxer (Flexeril) and Percocet together as these can cause significant sedation.

## 2019-01-17 NOTE — ED Provider Notes (Signed)
[facility] EMERGENCY DEPARTMENT Provider Note   CSN: [id number] Arrival date & time: 01/17/19  1204    History   Chief Complaint Chief Complaint  Patient presents with  . Back Pain    HPI George Wise is a 48 y.o. male.     HPI   48 year old male presents today with complaints of left lower back pain.  Patient notes that 3 days ago he had a mechanical fall landing on his back.  He cannot describe the fall or exactly how he landed.  He notes pain to his left lower back since that time that has progressively worsened.  He notes he feels a cold sensation in his feet from time to time, denies any loss of distal sensation strength or motor function.  He notes normal function of his bowel and bladder.  He reports a history of low back surgery in September 2019 performed by Dr. Saintclair Halsted.  He notes the pain feels similar to previous.  He has not tried any medications prior to arrival.  He is able to ambulate but has pain in his back with ambulation.  He denies any abdominal pain or fever.  Past Medical History:  Diagnosis Date  . Arthritis   . Diabetes mellitus without complication ([place])   . GERD (gastroesophageal reflux disease)   . Glucose intolerance (impaired glucose tolerance)   . Hyperlipidemia   . Hypertension   . Obesity   . OSA (obstructive sleep apnea) 02/17/2011  . PONV (postoperative nausea and vomiting)   . Sleep apnea   . Testicular cancer [facility]) 2001    Patient Active Problem List   Diagnosis Date Noted  . Acute post-operative pain 05/09/2017  . Intractable back pain 05/09/2017  . HNP (herniated nucleus pulposus), thoracic 05/06/2017  . Obesity 02/02/2012  . OSA (obstructive sleep apnea) 02/17/2011  . Encounter for examination for driving license [id number]/1191  . HYPERCHOLESTEROLEMIA 03/23/2009  . HYPERTENSION, BENIGN 03/23/2009    Past Surgical History:  Procedure Laterality Date  . HERNIA REPAIR    . LUMBAR LAMINECTOMY/DECOMPRESSION  MICRODISCECTOMY N/A 05/06/2017   Procedure: Microdiscectomy - Thoracic ten -Thoracic eleven  transpedicular;  Surgeon: Kary Kos, MD;  Location: [place];  Service: Neurosurgery;  Laterality: N/A;  . lymph node removal  2001   from adb. -20  . PLANTAR FASCIA RELEASE  09/2013  . TENDON RELEASE  05/2011   left foot  . TESTICLE SURGERY  2001   resection for cancer left        Home Medications    Prior to Admission medications   Medication Sig Start Date End Date Taking? Authorizing Provider  atorvastatin (LIPITOR) 40 MG tablet TAKE 1 TABLET BY MOUTH DAILY. Patient taking differently: TAKE 1 TABLET BY MOUTH IN THE EVENING 11/30/12   Bensimhon, Shaune Pascal, MD  cyclobenzaprine (FLEXERIL) 10 MG tablet Take 1 tablet (10 mg total) by mouth 2 (two) times daily as needed for muscle spasms. 01/17/19   Lurleen Soltero, Dellis Filbert, PA-C  lisinopril (PRINIVIL,ZESTRIL) 40 MG tablet TAKE 1 TABLET BY MOUTH DAILY. Patient taking differently: Take 40 mg by mouth every evening.  12/03/12   Bensimhon, Shaune Pascal, MD  meloxicam (MOBIC) 7.5 MG tablet Take 1 tablet (7.5 mg total) by mouth daily. 01/17/19   Javelle Donigan, Dellis Filbert, PA-C  metFORMIN (GLUCOPHAGE) 850 MG tablet Take 850 mg by mouth 2 (two) times daily with a meal.    [provider]  oxyCODONE-acetaminophen (PERCOCET/ROXICET) 5-325 MG tablet Take 1 tablet by mouth every 6 (  six) hours as needed. 01/17/19   Anasia Agro, Dellis Filbert, PA-C  pantoprazole (PROTONIX) 40 MG tablet Take 40 mg by mouth 2 (two) times daily.    [provider]  PARoxetine (PAXIL) 10 MG tablet TAKE 1 TABLET BY MOUTH EVERY MORNING. Patient taking differently: TAKE 1 TABLET BY MOUTH EVERY IN THE EVENING 12/10/12   Bensimhon, Shaune Pascal, MD    Family History Family History  Problem Relation Age of Onset  . Diabetes Paternal Grandfather   . Stroke Paternal Grandfather   . Ovarian cancer Paternal Grandmother   . Stomach cancer Paternal Grandmother     Social History Social History   Tobacco Use  .  Smoking status: Never Smoker  . Smokeless tobacco: Never Used  Substance Use Topics  . Alcohol use: No  . Drug use: No     Allergies   Patient has no allergy information on record.   Review of Systems Review of Systems  All other systems reviewed and are negative.    Physical Exam Updated Vital Signs BP (!) 163/93   Pulse 83   Temp 97.9 F (36.6 C) (Oral)   Resp 17   SpO2 99%   Physical Exam Vitals signs and nursing note reviewed.  Constitutional:      Appearance: He is well-developed.  HENT:     Head: Normocephalic and atraumatic.  Eyes:     General: No scleral icterus.       Right eye: No discharge.        Left eye: No discharge.     Conjunctiva/sclera: Conjunctivae normal.     Pupils: Pupils are equal, round, and reactive to light.  Neck:     Musculoskeletal: Normal range of motion.     Vascular: No JVD.     Trachea: No tracheal deviation.  Pulmonary:     Effort: Pulmonary effort is normal.     Breath sounds: No stridor.  Abdominal:     Comments: Abdomen soft nontender  Musculoskeletal:     Comments: No CT or L-spine tenderness palpation, tenderness palpation of left lateral lumbar soft tissue and musculature, straight leg negative bilateral, distal sensation strength and motor function intact extremities warm well perfused  Neurological:     Mental Status: He is alert and oriented to person, place, and time.     Coordination: Coordination normal.  Psychiatric:        Behavior: Behavior normal.        Thought Content: Thought content normal.        Judgment: Judgment normal.      ED Treatments / Results  Labs (all labs ordered are listed, but only abnormal results are displayed) Labs Reviewed - No data to display  EKG None  Radiology Dg Lumbar Spine Complete  Result Date: 01/17/2019 CLINICAL DATA:  48 year old male with a history of back pain EXAM: LUMBAR SPINE - COMPLETE 4+ VIEW COMPARISON:  None. FINDINGS: Lumbar Spine: Lumbar vertebral  elements maintain normal alignment without evidence of anterolisthesis, retrolisthesis, subluxation. No acute fracture line identified. Vertebral body heights maintained. Minimal endplate changes of the lumbar spine. No significant disc space narrowing. Degenerative endplate changes at the T11-T12 level. Oblique images demonstrate no displaced pars defect. Surgical changes of the retroperitoneum. IMPRESSION: Negative for acute fracture or malalignment of the lumbar spine Electronically Signed   By: Corrie Mckusick D.O.   On: 01/17/2019 13:54    Procedures Procedures (including critical care time)  Medications Ordered in ED Medications  oxyCODONE-acetaminophen (PERCOCET/ROXICET) 5-325 MG per  tablet 1 tablet (1 tablet Oral Given 01/17/19 1320)  oxyCODONE-acetaminophen (PERCOCET/ROXICET) 5-325 MG per tablet 1 tablet (1 tablet Oral Given 01/17/19 1418)     Initial Impression / Assessment and Plan / ED Course  I have reviewed the triage vital signs and the nursing notes.  Pertinent labs & imaging results that were available during my care of the patient were reviewed by me and considered in my medical decision making (see chart for details).        48 year old male presents today with left lower back pain.  This is after a fall  Given pain and recent fall plain films will be ordered.  Patient has no acute red flags for back pain.  No indication of vascular etiology.  I do find it reasonable to treat him with pain medication close outpatient follow-up and strict return precautions.  He verbalized understanding and agreement to today's plan had no further questions or concerns.  Final Clinical Impressions(s) / ED Diagnoses   Final diagnoses:  Acute left-sided low back pain without sciatica    ED Discharge Orders         Ordered    oxyCODONE-acetaminophen (PERCOCET/ROXICET) 5-325 MG tablet  Every 6 hours PRN     01/17/19 1412    meloxicam (MOBIC) 7.5 MG tablet  Daily     01/17/19 1412     cyclobenzaprine (FLEXERIL) 10 MG tablet  2 times daily PRN     01/17/19 1412           Okey Regal, PA-C 01/18/19 1812    Carmin Muskrat, MD 01/19/19 [phone]

## 2019-05-02 ENCOUNTER — Telehealth: Payer: Self-pay | Admitting: Pulmonary Disease

## 2019-05-02 NOTE — Telephone Encounter (Signed)
Spoke with patient. He stated that he received a call from [employer] in regards to his CPAP machine. [employer] advised him that "his insurance had dropped coverage" and that he is now responsible for a $39 monthly fee for a CPAP machine rental. He stated that he has never rented a cpap machine. He wanted to make sure that he hadn't missed any appointments. Advised him that his last visit was back in June 2020 and he is not due for a F/U until 2021.   Also advised him that I would reach out to [employer], he verbalized understanding.   Staff message has been sent to [facility] with [employer].

## 2019-05-05 NOTE — Telephone Encounter (Signed)
Cherina have you received a staff message about this?  Thank you!

## 2019-05-10 NOTE — Telephone Encounter (Signed)
Left message for patient to call back. Per [employer], all new CPAP starts are rentals until the machines have been paid off.

## 2019-09-12 IMAGING — CR LUMBAR SPINE - COMPLETE 4+ VIEW
5 series · 5 of 5 positions shown · non-contrast
Comparison: None.

CLINICAL DATA: 48-year-old male with a history of back pain

EXAM:
LUMBAR SPINE - COMPLETE 4+ VIEW

[l-spine ap]
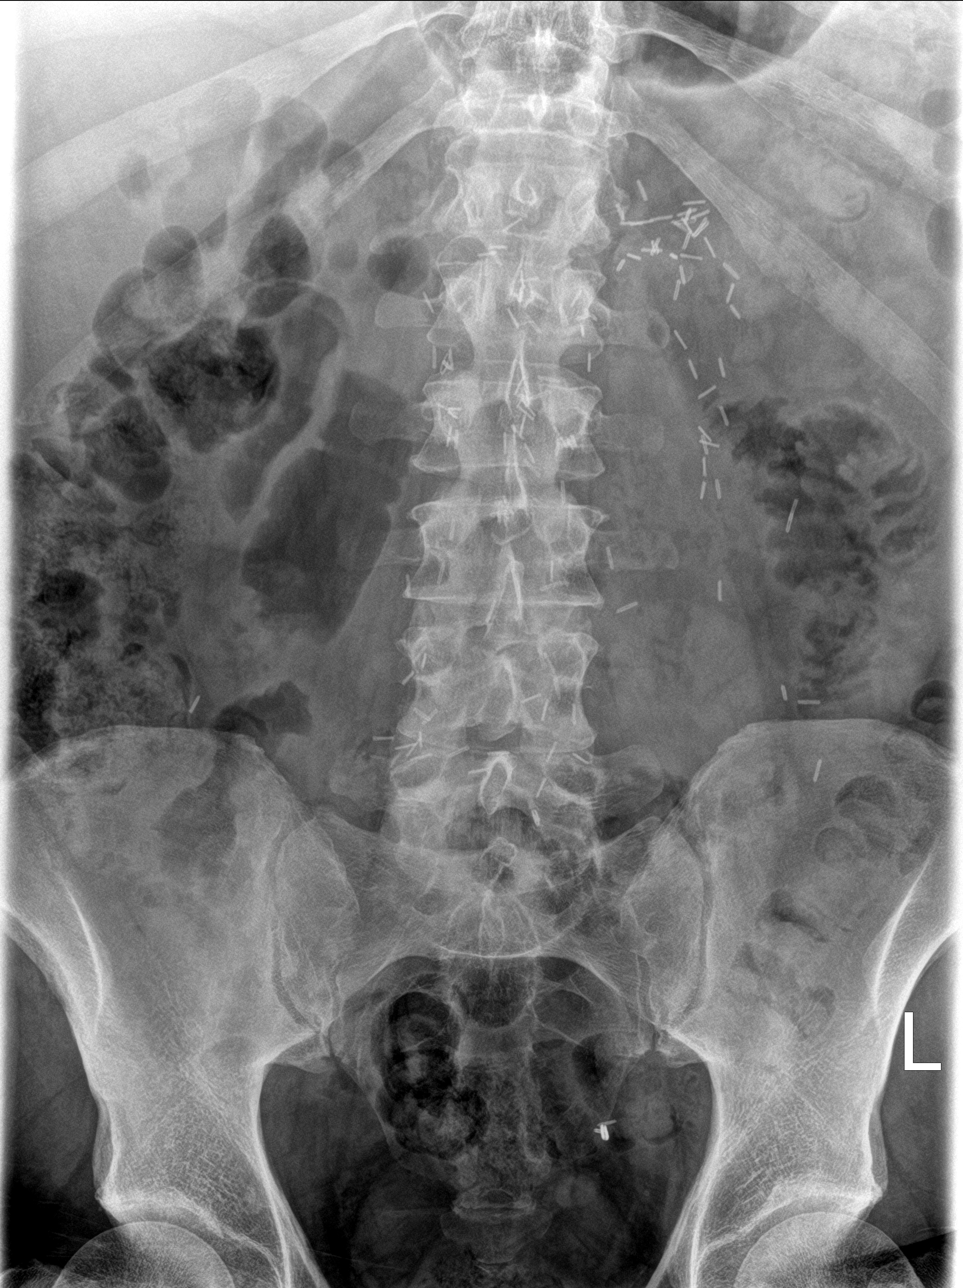

[l-spine obl (1 of 2)]
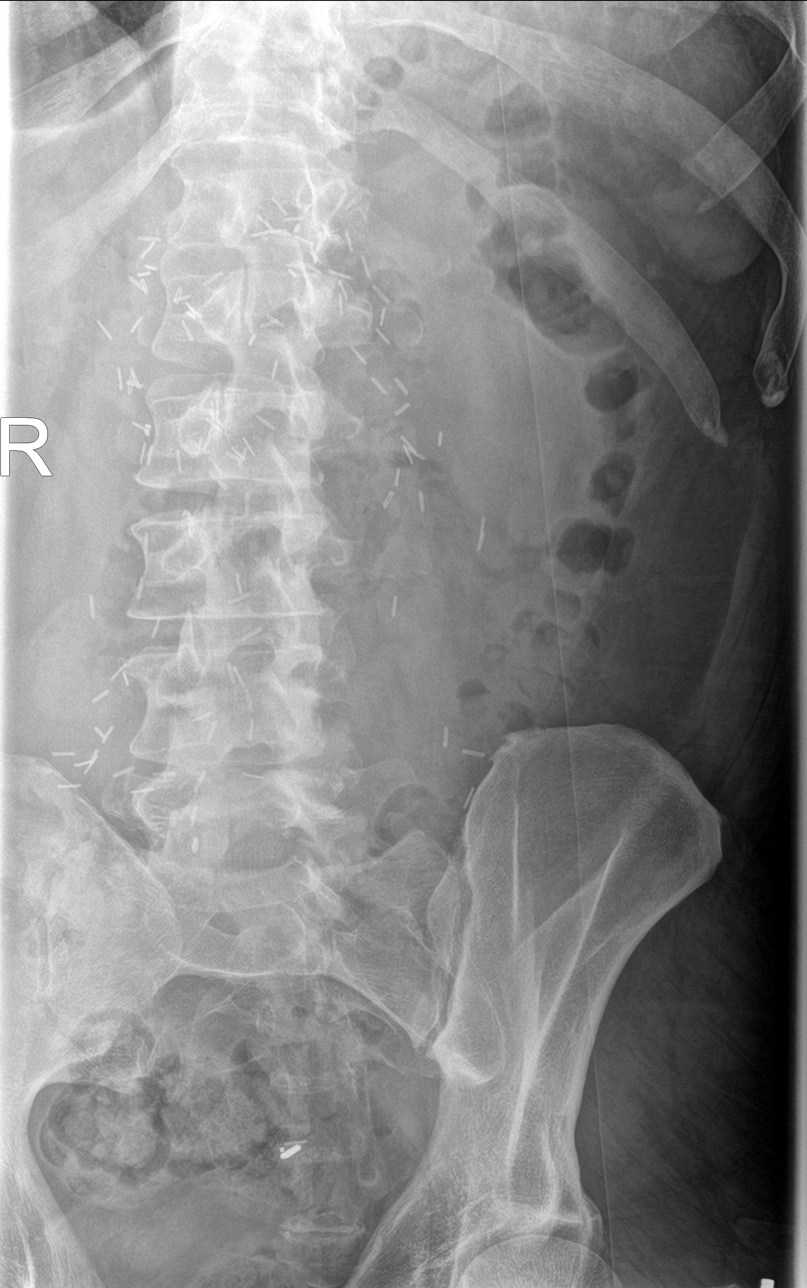

[l-spine obl (2 of 2)]
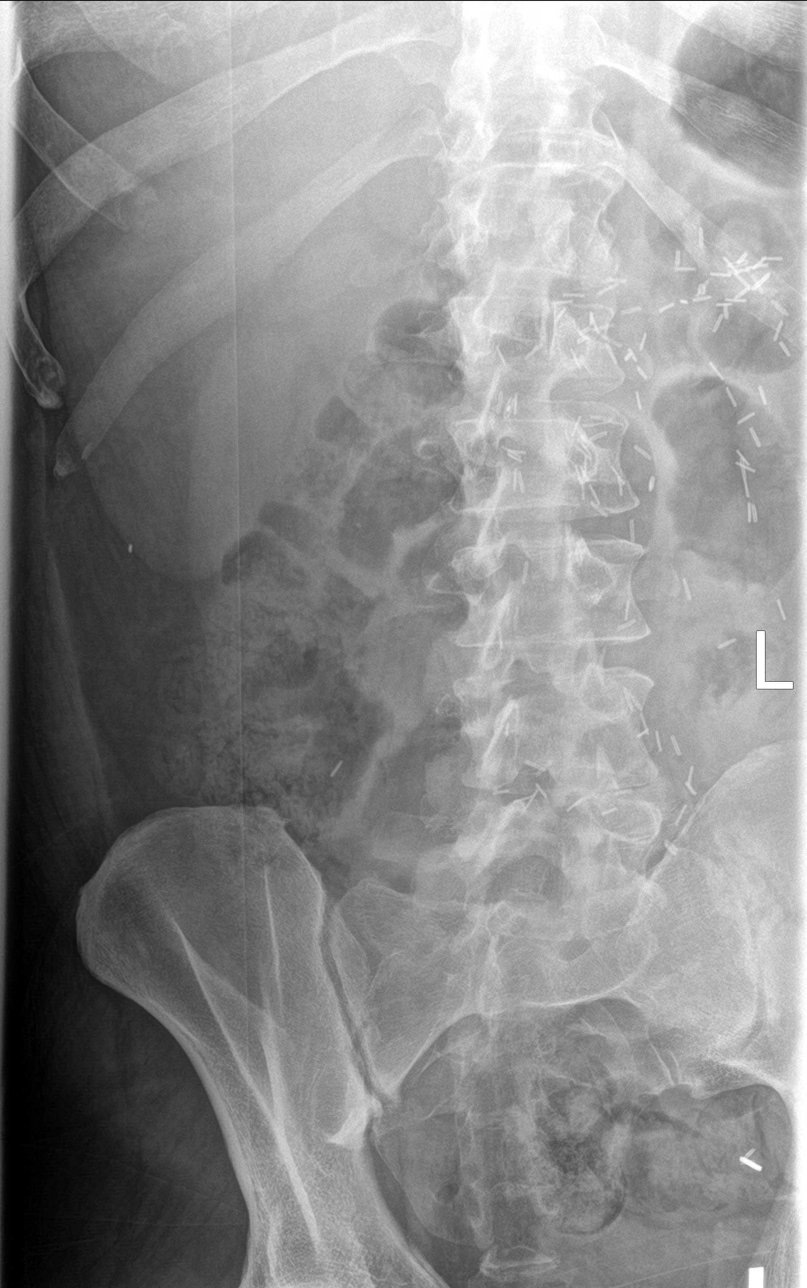

[l-spine lat]
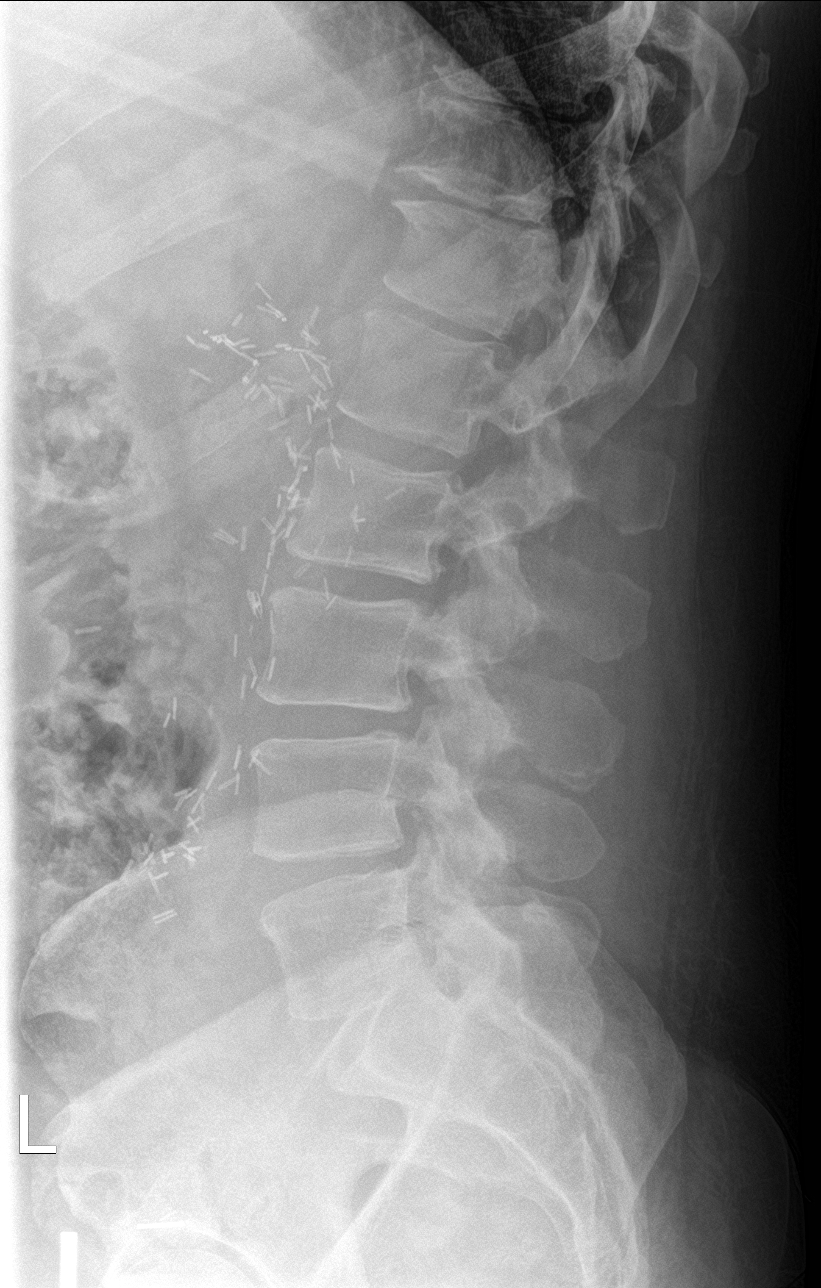

[l-spine spot]
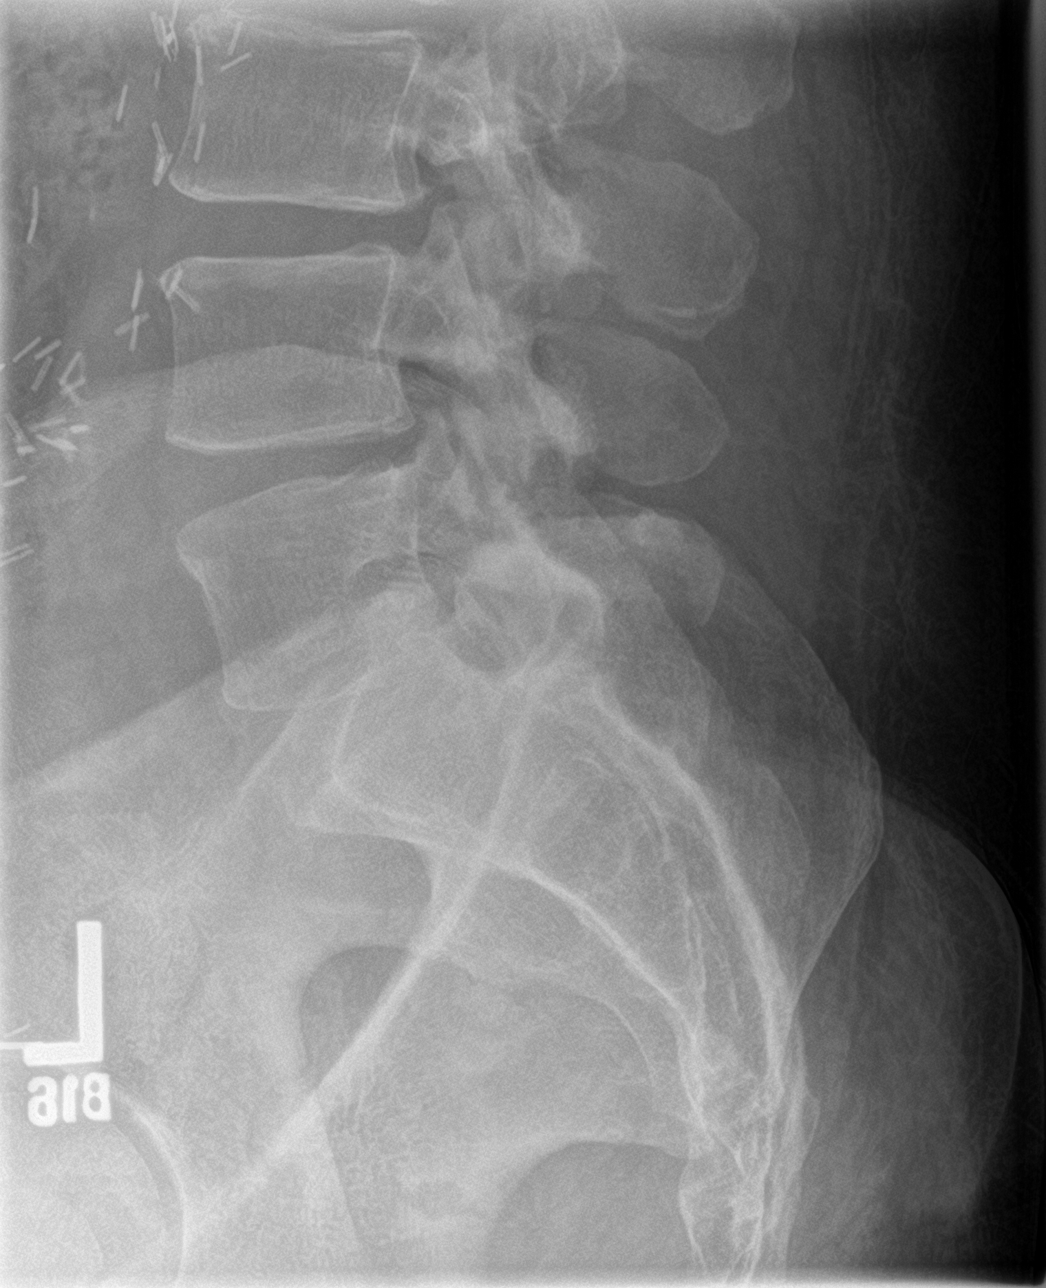

[5 of 5 positions shown; findings below may reference images not displayed]

FINDINGS: Lumbar Spine:

Lumbar vertebral elements maintain normal alignment without evidence
of anterolisthesis, retrolisthesis, subluxation.

No acute fracture line identified.

Vertebral body heights maintained.

Minimal endplate changes of the lumbar spine. No significant disc
space narrowing. Degenerative endplate changes at the T11-T12 level.

Oblique images demonstrate no displaced pars defect.

Surgical changes of the retroperitoneum.
IMPRESSION: Negative for acute fracture or malalignment of the lumbar spine

## 2019-09-23 ENCOUNTER — Ambulatory Visit: Payer: 59

## 2020-03-12 ENCOUNTER — Telehealth: Payer: Self-pay | Admitting: Pulmonary Disease

## 2020-03-12 DIAGNOSIS — G4733 Obstructive sleep apnea (adult) (pediatric): Secondary | ICD-10-CM

## 2020-03-12 NOTE — Telephone Encounter (Signed)
Pt had an upcoming OV scheduled with [facility] 04/04/20.  Called and spoke with pt who states he is needing Rx for new mask for cpap to be sent to [facility]. Dr. Jenetta Downer, please advise if you are okay with [place] sending this in for pt.

## 2020-03-13 NOTE — Telephone Encounter (Signed)
I am okay with prescription being sent

## 2020-03-13 NOTE — Telephone Encounter (Signed)
Order has been placed for new cpap mask. Called and spoke with pt letting him know that the Rx had been sent to [employer] for him to receive new mask and he verbalized understanding. Nothing further needed.

## 2020-03-28 ENCOUNTER — Telehealth: Payer: Self-pay | Admitting: Pulmonary Disease

## 2020-03-28 NOTE — Telephone Encounter (Signed)
Message sent to Melissa/Leah/Brad/Pam at [employer] to check status of order that was sent on 9/1.

## 2020-03-29 NOTE — Telephone Encounter (Signed)
Message received from [place] order was created and verified. Was sent to re-supply to for processing on 03/22/20.   I called pap supply & spoke to [facility].  She verified order received and she was going to call pt when we got off the phone to get him taken care of.  Nothing further needed.

## 2020-04-04 ENCOUNTER — Encounter: Payer: Self-pay | Admitting: Pulmonary Disease

## 2020-04-04 ENCOUNTER — Ambulatory Visit: Payer: 59 | Admitting: Pulmonary Disease

## 2020-04-04 ENCOUNTER — Other Ambulatory Visit: Payer: Self-pay

## 2020-04-04 VITALS — BP 124/80 | HR 94 | Temp 97.8°F | Ht 74.0 in | Wt 270.2 lb

## 2020-04-04 DIAGNOSIS — G4733 Obstructive sleep apnea (adult) (pediatric): Secondary | ICD-10-CM | POA: Diagnosis not present

## 2020-04-04 DIAGNOSIS — Z9989 Dependence on other enabling machines and devices: Secondary | ICD-10-CM | POA: Diagnosis not present

## 2020-04-04 NOTE — Patient Instructions (Signed)
Continue CPAP  We will call [facility] to find out why they are not very responsive  I will see you back in about a year  Call with significant concerns

## 2020-04-04 NOTE — Progress Notes (Signed)
Subjective:    Patient ID: George Wise, male    DOB: 09-19-1970, 49 y.o.   MRN: [id number]  Patient with a history of obstructive sleep apnea diagnosed about 9 years ago for which he has been using CPAP therapy  Recently obtained a new machine and this has been working well for him Diagnosed with sleep apnea 9 years ago Very compliant with CPAP use Usually goes to bed about 10 PM, wakes up about 6:20 AM Will wake up about once or twice during the night to go to the bathroom Denies morning headaches Denies dryness of his mouth in the mornings His memory is good  Dad has obstructive sleep apnea   Review of Systems  Constitutional: Positive for fatigue.  HENT: Negative.   Eyes: Negative.   Respiratory: Positive for apnea and cough.   Cardiovascular: Negative.   Gastrointestinal: Negative.   Endocrine: Negative.   Genitourinary: Negative.   Musculoskeletal: Positive for joint swelling.  Skin: Negative.   Allergic/Immunologic: Negative.   Neurological: Negative.   Hematological: Negative.   Psychiatric/Behavioral: Positive for sleep disturbance.   Past Medical History:  Diagnosis Date  . Arthritis   . Diabetes mellitus without complication ([place])   . GERD (gastroesophageal reflux disease)   . Glucose intolerance (impaired glucose tolerance)   . Hyperlipidemia   . Hypertension   . Obesity   . OSA (obstructive sleep apnea) 02/17/2011  . PONV (postoperative nausea and vomiting)   . Sleep apnea   . 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 Marital status: Married    Spouse name: Not on file  . Number of children: 1  . Years of education: Not on file  . Highest education level: Not on file  Occupational History  . Occupation: truck Geophysicist/field seismologist  Tobacco Use  . Smoking status: Never Smoker  . Smokeless tobacco: Never Used  Vaping Use  . Vaping Use: Never assessed  Substance and Sexual Activity  . Alcohol use: No  . Drug use: No  .  Sexual activity: Not on file  Other Topics Concern  . Not on file  Social History Narrative  . Not on file   Social Determinants of Health   Financial Resource Strain:   . Difficulty of Paying Living Expenses: Not on file  Food Insecurity:   . Worried About Charity fundraiser in the Last Year: Not on file  . Ran Out of Food in the Last Year: Not on file  Transportation Needs:   . Lack of Transportation (Medical): Not on file  . Lack of Transportation (Non-Medical): Not on file  Physical Activity:   . Days of Exercise per Week: Not on file  . Minutes of Exercise per Session: Not on file  Stress:   . Feeling of Stress : Not on file  Social Connections:   . Frequency of Communication with Friends and Family: Not on file  . Frequency of Social Gatherings with Friends and Family: Not on file  . Attends Religious Services: Not on file  . Active Member of Clubs or Organizations: Not on file  . Attends Archivist Meetings: Not on file  . Marital Status: Not on file  Intimate Partner Violence:   . Fear of Current or Ex-Partner: Not on file  . Emotionally Abused: Not on file  . Physically Abused: Not on file  . Sexually Abused: Not on file       Objective:   Physical  Exam Constitutional:      General: He is in acute distress.     Appearance: Normal appearance.  HENT:     Head: Normocephalic and atraumatic.  Cardiovascular:     Rate and Rhythm: Normal rate and regular rhythm.     Pulses: Normal pulses.     Heart sounds: Normal heart sounds. No murmur heard.   Pulmonary:     Effort: Pulmonary effort is normal. No respiratory distress.     Breath sounds: Normal breath sounds.  Musculoskeletal:     Cervical back: No rigidity. No muscular tenderness.  Neurological:     Mental Status: He is alert.     Results of the Epworth flowsheet 09/09/2018  Sitting and reading 3  Watching TV 3  Sitting, inactive in a public place (e.g. a theatre or a meeting) 1  As a  passenger in a car for an hour without a break 1  Lying down to rest in the afternoon when circumstances permit 2  Sitting and talking to someone 0  Sitting quietly after a lunch without alcohol 0  In a car, while stopped for a few minutes in traffic 0  Total score 10      Assessment & Plan:  .  History of obstructive sleep apnea -Past history of OSA -Has been using his CPAP on a regular basis -Feels better with using CPAP  .  Obesity -Weight has been relatively stable  .  Plan:  Encouraged to continue using CPAP on a regular basis  We will contact the medical supply company to facilitate CPAP supplies  We will follow-up with him in about a year  Encouraged to call with any significant concerns

## 2020-05-02 ENCOUNTER — Telehealth: Payer: Self-pay | Admitting: Pulmonary Disease

## 2020-05-02 DIAGNOSIS — G4733 Obstructive sleep apnea (adult) (pediatric): Secondary | ICD-10-CM

## 2020-05-02 NOTE — Telephone Encounter (Signed)
Will need to contact DME to get refitted for mask

## 2020-05-02 NOTE — Telephone Encounter (Signed)
Pt is retuning phone call - states that it would be easier to get him in the morning - [phone]

## 2020-05-02 NOTE — Telephone Encounter (Signed)
Called and spoke to patient. Patient is concerned that his mask is not fitting correctly. He stated that he has to tighten it up very tight. When he wakes in the morning his machine has a red face and he does not feel well rested. He does not think that his mask is leaking air.  He currently wears full face mask.    Dr. Ander Slade, please advise.

## 2020-05-02 NOTE — Telephone Encounter (Signed)
Lm for patient.  

## 2020-05-03 NOTE — Telephone Encounter (Signed)
Spoke with pt and advised to Dr Judson Roch recommendations. Order placed to [employer] for mask refit. Pt verbalized understanding. Nothing further needed at this time.

## 2020-05-07 ENCOUNTER — Telehealth: Payer: Self-pay | Admitting: Pulmonary Disease

## 2020-05-07 DIAGNOSIS — G4733 Obstructive sleep apnea (adult) (pediatric): Secondary | ICD-10-CM

## 2020-05-07 NOTE — Telephone Encounter (Signed)
See message above Aaron Edelman

## 2020-05-07 NOTE — Telephone Encounter (Signed)
Called and spoke with pt who states he would like to see if he could be fit for a different cpap mask. Pt states that he does wear his cpap every night, but when he wakes up in the morning, he wakes up with a red face from his current mask and also states that he feels tired every day too.  Pt said he has tightened his current mask up but still waking up not feeling like he has slept.  Stated to pt that we would send this to provider for recommendations and call him back once we have more info for him.  Download has been printed and will be given to [facility] for review.  Aaron Edelman, please advise.

## 2020-05-07 NOTE — Telephone Encounter (Signed)
LMTCB

## 2020-05-07 NOTE — Telephone Encounter (Signed)
05/07/2020  CPAP compliance report listed below:  04/07/2020-05/06/2020-30 on the last 30 days use, all 30 those days greater than 4 hours, average usage 7 hours and 23 minutes, APAP settings 5-15, AHI 0.8 >>>Leaks often  Despite the leaks patient's AHI is well controlled which is good news.  His home sleep study in March/2020 showed moderate obstructive sleep apnea with around 15 events an hour.  When using his CPAP that number comes down to 0.8.  It appears the patient has been fit for a new mask recently from [facility] per documentation.  This was recommended by Dr. Jenetta Downer.  Has this been completed?  If so would recommend ordering a mask desensitization and getting completed in the sleep lab with [facility] sleep tach.  Patient also needs to have follow-up in office with Dr. Jenetta Downer afterwards.  [name], FNP

## 2020-05-08 NOTE — Telephone Encounter (Signed)
Pt returning a phone call. Pt can be reached at [phone].

## 2020-05-08 NOTE — Telephone Encounter (Signed)
Lmtcb for pt.  

## 2020-05-08 NOTE — Telephone Encounter (Signed)
Spoke with the pt and notified of response per Aaron Edelman  Pt recalls having tried different masks from DME  Will go ahead and proceed with sleep lab mask desensitization  Pt aware to set up appt after this has been completed

## 2020-05-23 ENCOUNTER — Other Ambulatory Visit (HOSPITAL_BASED_OUTPATIENT_CLINIC_OR_DEPARTMENT_OTHER): Payer: 59 | Admitting: Internal Medicine

## 2023-04-02 ENCOUNTER — Encounter: Payer: Self-pay | Admitting: Internal Medicine

## 2023-04-02 ENCOUNTER — Ambulatory Visit (INDEPENDENT_AMBULATORY_CARE_PROVIDER_SITE_OTHER): Payer: 59 | Admitting: Internal Medicine

## 2023-04-02 VITALS — BP 129/87 | HR 76 | Temp 98.3°F | Ht 74.0 in | Wt 249.0 lb

## 2023-04-02 DIAGNOSIS — K219 Gastro-esophageal reflux disease without esophagitis: Secondary | ICD-10-CM | POA: Diagnosis not present

## 2023-04-02 DIAGNOSIS — D122 Benign neoplasm of ascending colon: Secondary | ICD-10-CM

## 2023-04-02 DIAGNOSIS — R195 Other fecal abnormalities: Secondary | ICD-10-CM | POA: Diagnosis not present

## 2023-04-02 DIAGNOSIS — R1012 Left upper quadrant pain: Secondary | ICD-10-CM

## 2023-04-02 DIAGNOSIS — K581 Irritable bowel syndrome with constipation: Secondary | ICD-10-CM | POA: Diagnosis not present

## 2023-04-02 NOTE — Patient Instructions (Signed)
For your irritable bowel syndrome with constipation, I am going to give you samples of Linzess 145 mcg daily.  We have room to increase or decrease dose depending on how you do.  Let us know in a week if your symptoms are improved and I will send in formal prescription.   Linzess works best when taken once a day every day, on an empty stomach, at least 30 minutes before your first meal of the day.  When Linzess is taken daily as directed:  *Constipation relief is typically felt in about a week *IBS-C patients may begin to experience relief from belly pain and overall abdominal symptoms (pain, discomfort, and bloating) in about 1 week,   with symptoms typically improving over 12 weeks.  Diarrhea may occur in the first 2 weeks -keep taking it.  The diarrhea should go away and you should start having normal, complete, full bowel movements. It may be helpful to start treatment when you can be near the comfort of your own bathroom, such as a weekend.   Follow-up in 3 months.  It was very nice meeting you today.  Dr. Marletta Lor

## 2023-04-02 NOTE — Progress Notes (Signed)
Primary Care Physician:  Kirstie Peri, MD Primary Gastroenterologist:  Dr. Marletta Lor  Chief Complaint  Patient presents with   New Patient (Initial Visit)    Pt referred for IBS    HPI:   George Wise is a 52 y.o. male who presents to clinic today by referral from his PCP Dr. Sherryll Burger for evaluation.  Patient notes history of IBS for many years.  Recently his symptoms have gotten worse since undergoing colonoscopy for surveillance purposes May 2024.  Multiple small tubular adenomas removed.  No mention of colitis.  States he has issues with what he believes is significant constipation.  States he had to perform 2-day prep for his colonoscopy.  Notes multiple small bowel movements daily, stool pieces are smaller than his thumb.  Occasional straining.  Also notes associated abdominal discomfort mild, intermittent, left lower quadrant, right lower quadrant, left upper quadrant.  Improved with bowel movements.  Also has chronic GERD for which he takes pantoprazole twice daily.  Symptoms well-controlled.  No dysphagia odynophagia.  No epigastric or chest pain.  No melena hematochezia.  Past Medical History:  Diagnosis Date   Arthritis    Diabetes mellitus without complication (HCC)    GERD (gastroesophageal reflux disease)    Glucose intolerance (impaired glucose tolerance)    Hyperlipidemia    Hypertension    Obesity    OSA (obstructive sleep apnea) 02/17/2011   PONV (postoperative nausea and vomiting)    Sleep apnea    Testicular cancer (HCC) 2001    Past Surgical History:  Procedure Laterality Date   HERNIA REPAIR     LUMBAR LAMINECTOMY/DECOMPRESSION MICRODISCECTOMY N/A 05/06/2017   Procedure: Microdiscectomy - Thoracic ten -Thoracic eleven  transpedicular;  Surgeon: Donalee Citrin, MD;  Location: [facility] OR;  Service: Neurosurgery;  Laterality: N/A;   lymph node removal  2001   from adb. -20   PLANTAR FASCIA RELEASE  09/2013   TENDON RELEASE  05/2011   left foot   TESTICLE SURGERY   2001   resection for cancer left    Current Outpatient Medications  Medication Sig Dispense Refill   cholecalciferol (VITAMIN D3) 25 MCG (1000 UNIT) tablet Take 1,000 Units by mouth daily.     empagliflozin (JARDIANCE) 25 MG TABS tablet Take by mouth daily.     lisinopril (PRINIVIL,ZESTRIL) 40 MG tablet TAKE 1 TABLET BY MOUTH DAILY. (Patient taking differently: Take 40 mg by mouth every evening.) 90 tablet 3   metFORMIN (GLUCOPHAGE) 850 MG tablet Take 850 mg by mouth 2 (two) times daily with a meal.     pantoprazole (PROTONIX) 40 MG tablet Take 40 mg by mouth 2 (two) times daily.     PARoxetine (PAXIL) 10 MG tablet TAKE 1 TABLET BY MOUTH EVERY MORNING. 90 tablet 3   rosuvastatin (CRESTOR) 20 MG tablet Take 20 mg by mouth daily.     No current facility-administered medications for this visit.    Allergies as of 04/02/2023   (Not on File)    Family History  Problem Relation Age of Onset   Diabetes Paternal Grandfather    Stroke Paternal Grandfather    Ovarian cancer Paternal Grandmother    Stomach cancer Paternal Grandmother     Social History   Socioeconomic History   Marital status: Married    Spouse name: Not on file   Number of children: 1   Years of education: Not on file   Highest education level: Not on file  Occupational History   Occupation: truck  driver  Tobacco Use   Smoking status: Never   Smokeless tobacco: Never  Vaping Use   Vaping status: Not on file  Substance and Sexual Activity   Alcohol use: No   Drug use: No   Sexual activity: Not on file  Other Topics Concern   Not on file  Social History Narrative   Not on file   Social Determinants of Health   Financial Resource Strain: Not on file  Food Insecurity: Not on file  Transportation Needs: No Transportation Needs (11/07/2021)   Received from [facility]   PRAPARE - Transportation    Lack of Transportation (Medical): No    Lack of Transportation (Non-Medical): No  Physical Activity: Not  on file  Stress: Not on file  Social Connections: Not on file  Intimate Partner Violence: Not on file    Subjective: Review of Systems  Constitutional:  Negative for chills and fever.  HENT:  Negative for congestion and hearing loss.   Eyes:  Negative for blurred vision and double vision.  Respiratory:  Negative for cough and shortness of breath.   Cardiovascular:  Negative for chest pain and palpitations.  Gastrointestinal:  Positive for abdominal pain, constipation and heartburn. Negative for blood in stool, diarrhea, melena and vomiting.  Genitourinary:  Negative for dysuria and urgency.  Musculoskeletal:  Negative for joint pain and myalgias.  Skin:  Negative for itching and rash.  Neurological:  Negative for dizziness and headaches.  Psychiatric/Behavioral:  Negative for depression. The patient is not nervous/anxious.        Objective: BP 129/87   Pulse 76   Temp 98.3 F (36.8 C)   Ht 6\' 2"  (1.88 m)   Wt 249 lb (112.9 kg)   BMI 31.97 kg/m  Physical Exam Constitutional:      Appearance: Normal appearance.  HENT:     Head: Normocephalic and atraumatic.  Eyes:     Extraocular Movements: Extraocular movements intact.     Conjunctiva/sclera: Conjunctivae normal.  Cardiovascular:     Rate and Rhythm: Normal rate and regular rhythm.  Pulmonary:     Effort: Pulmonary effort is normal.     Breath sounds: Normal breath sounds.  Abdominal:     General: Bowel sounds are normal.     Palpations: Abdomen is soft.  Musculoskeletal:        General: Normal range of motion.     Cervical back: Normal range of motion and neck supple.  Skin:    General: Skin is warm.  Neurological:     General: No focal deficit present.     Mental Status: He is alert and oriented to person, place, and time.  Psychiatric:        Mood and Affect: Mood normal.        Behavior: Behavior normal.      Assessment/Plan:  1.  Chronic GERD- well controlled on Pantoprazole twice daily. Will  continue.   2.  Abdominal pain/Constipation- Patient likely has component of IBS with constipation.  Will give samples of Linzess 145 mcg today and see how he does.  Counseled we have room to increase or decrease dose depending on how he does.  Call with update and I will send in formal prescription.  Counseled on initial washout period and he understands.  3.  Adenomatous colon polyps, colonoscopy recall 2029.  Follow-up in 3 months   04/02/2023 3:33 PM   Disclaimer: This note was dictated with voice recognition software. Similar sounding words can inadvertently  be transcribed and may not be corrected upon review.

## 2023-06-10 ENCOUNTER — Encounter: Payer: Self-pay | Admitting: Internal Medicine
